# Patient Record
Sex: Female | Born: 1975 | Race: Black or African American | Hispanic: No | Marital: Married | Smoking: Never smoker
Health system: Southern US, Community
[De-identification: ages and names within clinical notes are randomized; demographics above are authoritative.]

## PROBLEM LIST (undated history)

## (undated) DIAGNOSIS — D259 Leiomyoma of uterus, unspecified: Secondary | ICD-10-CM

## (undated) DIAGNOSIS — J45909 Unspecified asthma, uncomplicated: Secondary | ICD-10-CM

## (undated) DIAGNOSIS — D649 Anemia, unspecified: Secondary | ICD-10-CM

## (undated) DIAGNOSIS — E039 Hypothyroidism, unspecified: Secondary | ICD-10-CM

## (undated) HISTORY — DX: Unspecified asthma, uncomplicated: J45.909

## (undated) HISTORY — PX: WISDOM TOOTH EXTRACTION: SHX21

---

## 2007-08-28 ENCOUNTER — Ambulatory Visit: Payer: Self-pay | Admitting: Internal Medicine

## 2008-03-07 ENCOUNTER — Emergency Department: Payer: Self-pay | Admitting: Emergency Medicine

## 2011-08-22 LAB — HM PAP SMEAR: HM Pap smear: NORMAL

## 2012-03-03 DIAGNOSIS — N979 Female infertility, unspecified: Secondary | ICD-10-CM | POA: Insufficient documentation

## 2012-09-01 ENCOUNTER — Ambulatory Visit: Payer: Self-pay | Admitting: Obstetrics and Gynecology

## 2013-06-15 ENCOUNTER — Encounter: Payer: Self-pay | Admitting: Maternal & Fetal Medicine

## 2013-06-16 ENCOUNTER — Ambulatory Visit: Payer: Self-pay | Admitting: Oncology

## 2013-06-24 ENCOUNTER — Emergency Department: Payer: Self-pay | Admitting: Emergency Medicine

## 2013-06-24 LAB — CBC
HCT: 36.2 % (ref 35.0–47.0)
HGB: 11.5 g/dL — ABNORMAL LOW (ref 12.0–16.0)
MCH: 25.7 pg — ABNORMAL LOW (ref 26.0–34.0)
MCHC: 31.7 g/dL — ABNORMAL LOW (ref 32.0–36.0)
MCV: 81 fL (ref 80–100)
Platelet: 282 10*3/uL (ref 150–440)
RBC: 4.47 10*6/uL (ref 3.80–5.20)
RDW: 14.3 % (ref 11.5–14.5)
WBC: 10.2 10*3/uL (ref 3.6–11.0)

## 2013-06-24 LAB — COMPREHENSIVE METABOLIC PANEL
Albumin: 3.5 g/dL (ref 3.4–5.0)
Alkaline Phosphatase: 63 U/L
Anion Gap: 9 (ref 7–16)
BUN: 7 mg/dL (ref 7–18)
Bilirubin,Total: 0.3 mg/dL (ref 0.2–1.0)
Calcium, Total: 9 mg/dL (ref 8.5–10.1)
Chloride: 108 mmol/L — ABNORMAL HIGH (ref 98–107)
Co2: 22 mmol/L (ref 21–32)
Creatinine: 0.88 mg/dL (ref 0.60–1.30)
EGFR (African American): >60
EGFR (Non-African Amer.): >60
Glucose: 104 mg/dL — ABNORMAL HIGH (ref 65–99)
Osmolality: 276 (ref 275–301)
Potassium: 3.4 mmol/L — ABNORMAL LOW (ref 3.5–5.1)
SGOT(AST): 27 U/L (ref 15–37)
SGPT (ALT): 13 U/L (ref 12–78)
Sodium: 139 mmol/L (ref 136–145)
Total Protein: 7.9 g/dL (ref 6.4–8.2)

## 2013-06-24 LAB — HCG, QUANTITATIVE, PREGNANCY: Beta Hcg, Quant.: 3279 m[IU]/mL — ABNORMAL HIGH

## 2013-07-15 ENCOUNTER — Ambulatory Visit: Payer: Self-pay | Admitting: Oncology

## 2013-11-18 DIAGNOSIS — IMO0001 Reserved for inherently not codable concepts without codable children: Secondary | ICD-10-CM | POA: Insufficient documentation

## 2015-01-06 ENCOUNTER — Ambulatory Visit (INDEPENDENT_AMBULATORY_CARE_PROVIDER_SITE_OTHER): Payer: Managed Care, Other (non HMO) | Admitting: Internal Medicine

## 2015-01-06 ENCOUNTER — Encounter: Payer: Self-pay | Admitting: Internal Medicine

## 2015-01-06 VITALS — BP 108/72 | HR 69 | Temp 98.0°F | Ht 63.0 in | Wt 182.0 lb

## 2015-01-06 DIAGNOSIS — Z23 Encounter for immunization: Secondary | ICD-10-CM | POA: Diagnosis not present

## 2015-01-06 DIAGNOSIS — J4520 Mild intermittent asthma, uncomplicated: Secondary | ICD-10-CM | POA: Diagnosis not present

## 2015-01-06 DIAGNOSIS — S161XXD Strain of muscle, fascia and tendon at neck level, subsequent encounter: Secondary | ICD-10-CM

## 2015-01-06 DIAGNOSIS — S161XXA Strain of muscle, fascia and tendon at neck level, initial encounter: Secondary | ICD-10-CM

## 2015-01-06 DIAGNOSIS — J45909 Unspecified asthma, uncomplicated: Secondary | ICD-10-CM | POA: Insufficient documentation

## 2015-01-06 NOTE — Progress Notes (Signed)
Pre visit review using our clinic review tool, if applicable. No additional management support is needed unless otherwise documented below in the visit note. 

## 2015-01-06 NOTE — Patient Instructions (Signed)
Cervical Sprain  A cervical sprain is an injury in the neck in which the strong, fibrous tissues (ligaments) that connect your neck bones stretch or tear. Cervical sprains can range from mild to severe. Severe cervical sprains can cause the neck vertebrae to be unstable. This can lead to damage of the spinal cord and can result in serious nervous system problems. The amount of time it takes for a cervical sprain to get better depends on the cause and extent of the injury. Most cervical sprains heal in 1 to 3 weeks.  CAUSES   Severe cervical sprains may be caused by:    Contact sport injuries (such as from football, rugby, wrestling, hockey, auto racing, gymnastics, diving, martial arts, or boxing).    Motor vehicle collisions.    Whiplash injuries. This is an injury from a sudden forward and backward whipping movement of the head and neck.   Falls.   Mild cervical sprains may be caused by:    Being in an awkward position, such as while cradling a telephone between your ear and shoulder.    Sitting in a chair that does not offer proper support.    Working at a poorly designed computer station.    Looking up or down for long periods of time.   SYMPTOMS    Pain, soreness, stiffness, or a burning sensation in the front, back, or sides of the neck. This discomfort may develop immediately after the injury or slowly, 24 hours or more after the injury.    Pain or tenderness directly in the middle of the back of the neck.    Shoulder or upper back pain.    Limited ability to move the neck.    Headache.    Dizziness.    Weakness, numbness, or tingling in the hands or arms.    Muscle spasms.    Difficulty swallowing or chewing.    Tenderness and swelling of the neck.   DIAGNOSIS   Most of the time your health care provider can diagnose a cervical sprain by taking your history and doing a physical exam. Your health care provider will ask about previous neck injuries and any known neck  problems, such as arthritis in the neck. X-rays may be taken to find out if there are any other problems, such as with the bones of the neck. Other tests, such as a CT scan or MRI, may also be needed.   TREATMENT   Treatment depends on the severity of the cervical sprain. Mild sprains can be treated with rest, keeping the neck in place (immobilization), and pain medicines. Severe cervical sprains are immediately immobilized. Further treatment is done to help with pain, muscle spasms, and other symptoms and may include:   Medicines, such as pain relievers, numbing medicines, or muscle relaxants.    Physical therapy. This may involve stretching exercises, strengthening exercises, and posture training. Exercises and improved posture can help stabilize the neck, strengthen muscles, and help stop symptoms from returning.   HOME CARE INSTRUCTIONS    Put ice on the injured area.     Put ice in a plastic bag.     Place a towel between your skin and the bag.     Leave the ice on for 15-20 minutes, 3-4 times a day.    If your injury was severe, you may have been given a cervical collar to wear. A cervical collar is a two-piece collar designed to keep your neck from moving while it heals.      Do not remove the collar unless instructed by your health care provider.    If you have long hair, keep it outside of the collar.    Ask your health care provider before making any adjustments to your collar. Minor adjustments may be required over time to improve comfort and reduce pressure on your chin or on the back of your head.    Ifyou are allowed to remove the collar for cleaning or bathing, follow your health care provider's instructions on how to do so safely.    Keep your collar clean by wiping it with mild soap and water and drying it completely. If the collar you have been given includes removable pads, remove them every 1-2 days and hand wash them with soap and water. Allow them to air dry. They should be completely  dry before you wear them in the collar.    If you are allowed to remove the collar for cleaning and bathing, wash and dry the skin of your neck. Check your skin for irritation or sores. If you see any, tell your health care provider.    Do not drive while wearing the collar.    Only take over-the-counter or prescription medicines for pain, discomfort, or fever as directed by your health care provider.    Keep all follow-up appointments as directed by your health care provider.    Keep all physical therapy appointments as directed by your health care provider.    Make any needed adjustments to your workstation to promote good posture.    Avoid positions and activities that make your symptoms worse.    Warm up and stretch before being active to help prevent problems.   SEEK MEDICAL CARE IF:    Your pain is not controlled with medicine.    You are unable to decrease your pain medicine over time as planned.    Your activity level is not improving as expected.   SEEK IMMEDIATE MEDICAL CARE IF:    You develop any bleeding.   You develop stomach upset.   You have signs of an allergic reaction to your medicine.    Your symptoms get worse.    You develop new, unexplained symptoms.    You have numbness, tingling, weakness, or paralysis in any part of your body.   MAKE SURE YOU:    Understand these instructions.   Will watch your condition.   Will get help right away if you are not doing well or get worse.     This information is not intended to replace advice given to you by your health care provider. Make sure you discuss any questions you have with your health care provider.     Document Released: 10/29/2006 Document Revised: 01/06/2013 Document Reviewed: 07/09/2012  Elsevier Interactive Patient Education 2016 [employer]

## 2015-01-06 NOTE — Assessment & Plan Note (Signed)
Avoid triggers Continue Albuterol prn
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
## 2015-01-17 ENCOUNTER — Ambulatory Visit (INDEPENDENT_AMBULATORY_CARE_PROVIDER_SITE_OTHER): Payer: Managed Care, Other (non HMO) | Admitting: Urgent Care

## 2015-01-17 VITALS — BP 104/64 | HR 82 | Temp 98.5°F | Resp 16 | Ht 64.0 in | Wt 184.0 lb

## 2015-01-17 DIAGNOSIS — R350 Frequency of micturition: Secondary | ICD-10-CM | POA: Diagnosis not present

## 2015-01-17 DIAGNOSIS — T7840XA Allergy, unspecified, initial encounter: Secondary | ICD-10-CM

## 2015-01-17 LAB — POCT URINALYSIS DIP (MANUAL ENTRY)
Bilirubin, UA: NEGATIVE
Glucose, UA: NEGATIVE
Ketones, POC UA: NEGATIVE
Leukocytes, UA: NEGATIVE
Nitrite, UA: NEGATIVE
Protein Ur, POC: NEGATIVE
Spec Grav, UA: 1.01
Urobilinogen, UA: 0.2
pH, UA: 5

## 2015-01-17 LAB — POC MICROSCOPIC URINALYSIS (UMFC): Mucus: ABSENT

## 2015-01-17 LAB — POCT URINE PREGNANCY: Preg Test, Ur: NEGATIVE

## 2015-01-17 NOTE — Patient Instructions (Signed)
-   Please take Benadryl 25-50mg  every 6 to 8 hours - OR you can take Zyrtec (cetirizine) 10mg  once daily with Zantac (ranitidine) twice daily to avoid sleepiness.   Allergies An allergy is an abnormal reaction to a substance by the body's defense system (immune system). Allergies can develop at any age. WHAT CAUSES ALLERGIES? An allergic reaction happens when the immune system mistakenly reacts to a normally harmless substance, called an allergen, as if it were harmful. The immune system releases antibodies to fight the substance. Antibodies eventually release a chemical called histamine into the bloodstream. The release of histamine is meant to protect the body from infection, but it also causes discomfort. An allergic reaction can be triggered by:  Eating an allergen.  Inhaling an allergen.  Touching an allergen. WHAT TYPES OF ALLERGIES ARE THERE? There are many types of allergies. Common types include:  Seasonal allergies. People with this type of allergy are usually allergic to substances that are only present during certain seasons, such as molds and pollens.  Food allergies.  Drug allergies.  Insect allergies.  Animal dander allergies. WHAT ARE SYMPTOMS OF ALLERGIES? Possible allergy symptoms include:  Swelling of the lips, face, tongue, mouth, or throat.  Sneezing, coughing, or wheezing.  Nasal congestion.  Tingling in the mouth.  Rash.  Itching.  Itchy, red, swollen areas of skin (hives).  Watery eyes.  Vomiting.  Diarrhea.  Dizziness.  Lightheadedness.  Fainting.  Trouble breathing or swallowing.  Chest tightness.  Rapid heartbeat. HOW ARE ALLERGIES DIAGNOSED? Allergies are diagnosed with a medical and family history and one or more of the following:  Skin tests.  Blood tests.  A food diary. A food diary is a record of all the foods and drinks you have in a day and of all the symptoms you experience.  The results of an elimination diet. An  elimination diet involves eliminating foods from your diet and then adding them back in one by one to find out if a certain food causes an allergic reaction. HOW ARE ALLERGIES TREATED? There is no cure for allergies, but allergic reactions can be treated with medicine. Severe reactions usually need to be treated at a hospital. HOW CAN REACTIONS BE PREVENTED? The best way to prevent an allergic reaction is by avoiding the substance you are allergic to. Allergy shots and medicines can also help prevent reactions in some cases. People with severe allergic reactions may be able to prevent a life-threatening reaction called anaphylaxis with a medicine given right after exposure to the allergen.   This information is not intended to replace advice given to you by your health care provider. Make sure you discuss any questions you have with your health care provider.   Document Released: 03/27/2002 Document Revised: 01/22/2014 Document Reviewed: 10/13/2013 Elsevier Interactive Patient Education [employer].

## 2015-01-17 NOTE — Progress Notes (Signed)
MRN: [id number] DOB: 09/02/1975  Subjective:   Michelle Dudley is a 40 y.o. female presenting for chief complaint of Allergic Reaction  Allergic reaction - Reports 1 day history of reaction to permethrin spray. Patient is planning on traveling, sprayed her clothes as instructed on the spray bottle. Patient started feeling itching in her hands and nose after packing the clothes that she used the spray on. Reports that her chest started feeling tight, arms felt heavy. Took a benadryl x1 last night, took it again twice today. Also, used her albuterol inhaler once. She has since washed her clothes multiple times. Denies fever, shob, wheezing, facial swelling, throat closing, rash, vomiting, abdominal pain. Denies smoking cigarettes, has ~1/2 drink of alcohol once a week.  Urinary frequency - Reports 1 day history of urinary frequency. She is trying to get pregnant with her husband and is wondering if this is a reaction to benadryl or pregnancy. She denies polydipsia, dysuria, hematuria, cloudy urine. Admits family history of diabetes in mother, siblings. Also has family history of hyperlipidemia.  Michelle Dudley has a current medication list which includes the following prescription(s): albuterol, amino acids, cholecalciferol, co-enzyme q-10, multivitamin, and cyclobenzaprine. Also has No Known Allergies.  Michelle Dudley  has a past medical history of Allergy-induced asthma. Also  has past surgical history that includes Wisdom tooth extraction.  Objective:   Vitals: BP 104/64 mmHg  Pulse 82  Temp(Src) 98.5 F (36.9 C)  Resp 16  Ht 5\' 4"  (1.626 m)  Wt 184 lb (83.462 kg)  BMI 31.57 kg/m2  SpO2 98%  LMP 12/24/2014  Physical Exam  Constitutional: She is oriented to person, place, and time. She appears well-developed and well-nourished.  HENT:  Mouth/Throat: Oropharynx is clear and moist.  TM's intact bilaterally, no effusions or erythema. Nasal turbinates pink and moist, nasal passages patent. No sinus  tenderness. Oropharynx clear, mucous membranes moist, dentition in good repair.  Eyes: Right eye exhibits no discharge. Left eye exhibits no discharge. No scleral icterus.  Neck: No thyromegaly present.  Cardiovascular: Normal rate, regular rhythm and intact distal pulses.  Exam reveals no gallop and no friction rub.   No murmur heard. Pulmonary/Chest: No respiratory distress. She has no wheezes. She has no rales.  Abdominal: Soft. Bowel sounds are normal. She exhibits no distension and no mass. There is no tenderness.  Neurological: She is alert and oriented to person, place, and time.  Skin: Skin is warm and dry. No rash noted. No erythema. No pallor.   Results for orders placed or performed in visit on 01/17/15 (from the past 24 hour(s))  POCT urinalysis dipstick     Status: Abnormal   Collection Time: 01/17/15  8:11 PM  Result Value Ref Range   Color, UA yellow yellow   Clarity, UA clear clear   Glucose, UA negative negative   Bilirubin, UA negative negative   Ketones, POC UA negative negative   Spec Grav, UA 1.010    Blood, UA trace-lysed (A) negative   pH, UA 5.0    Protein Ur, POC negative negative   Urobilinogen, UA 0.2    Nitrite, UA Negative Negative   Leukocytes, UA Negative Negative  POCT Microscopic Urinalysis ([facility])     Status: Abnormal   Collection Time: 01/17/15  8:11 PM  Result Value Ref Range   WBC,UR,HPF,POC None None WBC/hpf   RBC,UR,HPF,POC None None RBC/hpf   Bacteria None None, Too numerous to count   Mucus Absent Absent   Epithelial Cells, UR Per  Microscopy Few (A) None, Too numerous to count cells/hpf  POCT urine pregnancy     Status: None   Collection Time: 01/17/15  8:11 PM  Result Value Ref Range   Preg Test, Ur Negative Negative   Assessment and Plan :   1. Allergic reaction, initial encounter - Counseled on skin reactions to allergens. Patient's physical exam findings are very reassuring. RTC if worsening symptoms develop.  2. Urinary  frequency - Likely related to her cycle which patient thinks is coming in the next couple of days. Continue to monitor, if symptoms of infection develop   Michelle Eagles, PA-C [facility] [phone] 01/17/2015 7:49 PM

## 2015-01-24 ENCOUNTER — Encounter: Payer: Self-pay | Admitting: Gastroenterology

## 2015-01-28 ENCOUNTER — Encounter: Payer: Self-pay | Admitting: Internal Medicine

## 2015-01-28 ENCOUNTER — Ambulatory Visit (INDEPENDENT_AMBULATORY_CARE_PROVIDER_SITE_OTHER): Payer: Managed Care, Other (non HMO) | Admitting: Internal Medicine

## 2015-01-28 VITALS — BP 106/68 | HR 72 | Temp 98.2°F | Wt 178.0 lb

## 2015-01-28 DIAGNOSIS — R12 Heartburn: Secondary | ICD-10-CM | POA: Diagnosis not present

## 2015-01-28 DIAGNOSIS — R42 Dizziness and giddiness: Secondary | ICD-10-CM | POA: Diagnosis not present

## 2015-01-28 DIAGNOSIS — H6982 Other specified disorders of Eustachian tube, left ear: Secondary | ICD-10-CM | POA: Diagnosis not present

## 2015-01-28 LAB — CBC
HCT: 38.7 % (ref 36.0–46.0)
Hemoglobin: 12 g/dL (ref 12.0–15.0)
MCHC: 31 g/dL (ref 30.0–36.0)
MCV: 80.1 fl (ref 78.0–100.0)
Platelets: 326 10*3/uL (ref 150.0–400.0)
RBC: 4.83 Mil/uL (ref 3.87–5.11)
RDW: 15.5 % (ref 11.5–15.5)
WBC: 5.9 10*3/uL (ref 4.0–10.5)

## 2015-01-28 LAB — COMPREHENSIVE METABOLIC PANEL
ALT: 11 U/L (ref 0–35)
AST: 20 U/L (ref 0–37)
Albumin: 4.1 g/dL (ref 3.5–5.2)
Alkaline Phosphatase: 45 U/L (ref 39–117)
BUN: 8 mg/dL (ref 6–23)
CO2: 26 mEq/L (ref 19–32)
Calcium: 9.6 mg/dL (ref 8.4–10.5)
Chloride: 106 mEq/L (ref 96–112)
Creatinine, Ser: 0.89 mg/dL (ref 0.40–1.20)
GFR: 91.56 mL/min (ref >60.00)
Glucose, Bld: 82 mg/dL (ref 70–99)
Potassium: 4.1 mEq/L (ref 3.5–5.1)
Sodium: 138 mEq/L (ref 135–145)
Total Bilirubin: 0.5 mg/dL (ref 0.2–1.2)
Total Protein: 7.9 g/dL (ref 6.0–8.3)

## 2015-01-28 LAB — TSH: TSH: 3.18 u[IU]/mL (ref 0.35–4.50)

## 2015-01-28 NOTE — Progress Notes (Signed)
Subjective:    Patient ID: Michelle Dudley, female    DOB: [birth year], 40 y.o.   MRN: [id number]  HPI  Pt presents to the clinic today with multiple complaints:  1- Dizziness. This started 2 weeks ago, while she was in [place]. She describes it as a sense of imbalance. It is intermittent. She reports this is associated with nausea, and blurred vision.  She does report her limbs feel "heavy" when she is experiencing the dizziness. She denies near syncope or headache. She denies dizziness with head movements or changes in positions. She has not tried anything OTC.  2- She also reports a burning sensation in her chest. This started 3 weeks ago. She has noticed some nausea, gas and bloating but denies belching vomiting, diarrhea, constipation or blood in her stool. She thinks it may be reflux. The pain is worse after eating or when laying down. She is having a BM every other day. Her stool ranges from normal to a little loose. This morning her stool was normal in color, but she has noticed that it has contained mucous at times. She has taken Pepto Bismol with some relief.  3- She also c/o left ear fullness. This started 3 weeks ago. She denies runny nose, nasal congestion or sore throat. She denies fever, chills or body aches. She has not tried anything OTC. She has a history of seasonal allergies but does not take anything for them on a consistent basis.  Review of Systems  Past Medical History  Diagnosis Date  . Allergy-induced asthma     Current Outpatient Prescriptions  Medication Sig Dispense Refill  . albuterol (PROVENTIL HFA;VENTOLIN HFA) 108 (90 BASE) MCG/ACT inhaler Inhale 1-2 puffs into the lungs every 6 (six) hours as needed for wheezing or shortness of breath.    . Amino Acids (L-CARNITINE PO) Take 1 capsule by mouth daily.    . cholecalciferol (VITAMIN D) 1000 UNITS tablet Take 1,000 Units by mouth every other day.    Marland Kitchen co-enzyme Q-10 50 MG capsule Take 50 mg by mouth daily.      . cyclobenzaprine (FLEXERIL) 10 MG tablet Reported on 01/17/2015    . Multiple Vitamin (MULTIVITAMIN) tablet Take 1 tablet by mouth daily.     No current facility-administered medications for this visit.    No Known Allergies  Family History  Problem Relation Age of Onset  . Hypertension Mother   . Hypertension Father   . Kidney disease Father   . Diabetes Father   . Uterine cancer Maternal Aunt   . Cancer Maternal Aunt     uterine  . Diabetes Maternal Grandmother     Social History   Social History  . Marital Status: Married    Spouse Name: N/A  . Number of Children: N/A  . Years of Education: N/A   Occupational History  . Not on file.   Social History Main Topics  . Smoking status: Never Smoker   . Smokeless tobacco: Never Used  . Alcohol Use: 0.0 oz/week    0 Standard drinks or equivalent per week     Comment: occasiona;  Marland Kitchen Drug Use: No  . Sexual Activity: Yes   Other Topics Concern  . Not on file   Social History Narrative     Constitutional: Denies fever, malaise, fatigue, headache or abrupt weight changes.  HEENT: Pt reports ear fullness. Denies eye pain, eye redness, ear pain, ringing in the ears, wax buildup, runny nose, nasal congestion, bloody nose, or  sore throat. Respiratory: Denies difficulty breathing, shortness of breath, cough or sputum production.   Cardiovascular: Denies chest pain, chest tightness, palpitations or swelling in the hands or feet.  Gastrointestinal: Pt reports burning sensation in stomach. Denies constipation, diarrhea or blood in the stool.   Neurological: Pt reports dizziness. Denies difficulty with memory, difficulty with speech or problems with balance and coordination.    No other specific complaints in a complete review of systems (except as listed in HPI above).     Objective:   Physical Exam   BP 106/68 mmHg  Pulse 72  Temp(Src) 98.2 F (36.8 C) (Oral)  Wt 178 lb (80.74 kg)  SpO2 98%  LMP 01/20/2015 Wt  Readings from Last 3 Encounters:  01/28/15 178 lb (80.74 kg)  01/17/15 184 lb (83.462 kg)  01/06/15 182 lb (82.555 kg)    General: Appears her stated age, well developed, well nourished in NAD. HEENT: Head: normal shape and size, no sinus tenderness; Ears: Tm's gray and intact, normal light reflex, + effusion on the left; Nose: mucosa pink and moist, septum midline; Throat/Mouth: Teeth present, mucosa pink and moist, no exudate, lesions or ulcerations noted.  Neck:  No adenopathy noted.  Cardiovascular: Normal rate and rhythm. S1,S2 noted.  No murmur, rubs or gallops noted.  Pulmonary/Chest: Normal effort and positive vesicular breath sounds. No respiratory distress. No wheezes, rales or ronchi noted.  Abdomen: Soft and nontender. Normal bowel sounds. Neurological: Alert and oriented. No nystagmus. Coordination normal.    BMET    Component Value Date/Time   NA 139 06/24/2013 0307   K 3.4* 06/24/2013 0307   CL 108* 06/24/2013 0307   CO2 22 06/24/2013 0307   GLUCOSE 104* 06/24/2013 0307   BUN 7 06/24/2013 0307   CREATININE 0.88 06/24/2013 0307   CALCIUM 9.0 06/24/2013 0307   GFRNONAA >60 06/24/2013 0307   GFRAA >60 06/24/2013 0307    Lipid Panel  No results found for: CHOL, TRIG, HDL, CHOLHDL, VLDL, LDLCALC  CBC    Component Value Date/Time   WBC 10.2 06/24/2013 0307   RBC 4.47 06/24/2013 0307   HGB 11.5* 06/24/2013 0307   HCT 36.2 06/24/2013 0307   PLT 282 06/24/2013 0307   MCV 81 06/24/2013 0307   MCH 25.7* 06/24/2013 0307   MCHC 31.7* 06/24/2013 0307   RDW 14.3 06/24/2013 0307    Hgb A1C No results found for: HGBA1C      Assessment & Plan:   Heartburn:  Avoid NSAIDs or any foods that may cause this burning sensation in your chest Try Prilosec 20 mg daily for 14 days Avoid eating too much or laying down right affter you eat.  ETD, left and dizziness:  Start taking Flonase BID x 1 week If persists, consider Prednisone Will check TSH, CBC and CMET  today  Will reevaluated in 2 weeks, sooner if worse

## 2015-01-28 NOTE — Patient Instructions (Signed)

## 2015-01-28 NOTE — Progress Notes (Signed)
Pre visit review using our clinic review tool, if applicable. No additional management support is needed unless otherwise documented below in the visit note. 

## 2015-02-02 ENCOUNTER — Encounter: Payer: Self-pay | Admitting: Internal Medicine

## 2015-03-15 ENCOUNTER — Ambulatory Visit (INDEPENDENT_AMBULATORY_CARE_PROVIDER_SITE_OTHER): Payer: Managed Care, Other (non HMO) | Admitting: Gastroenterology

## 2015-03-15 ENCOUNTER — Encounter: Payer: Self-pay | Admitting: Gastroenterology

## 2015-03-15 VITALS — BP 118/74 | HR 84 | Ht 63.0 in | Wt 178.4 lb

## 2015-03-15 DIAGNOSIS — K219 Gastro-esophageal reflux disease without esophagitis: Secondary | ICD-10-CM | POA: Diagnosis not present

## 2015-03-15 NOTE — Progress Notes (Signed)
Gastroenterology and Hepatology Consult Note:  History: Michelle Dudley 03/15/2015  Referring physician: Webb Silversmith, NP  Reason for consult/chief complaint: Gas; Heartburn; and Gastroesophageal Reflux   Subjective HPI:  This is a delightful 40 year old woman self-referred for some upper GI symptoms. During a recent trip to [place] she had several episodes of pyrosis while laying down, associated with some feelings of numbness in the left arm and dizziness. She is not recall having them prior to that. Since then she's had some episodes where he feels that there is gas trapped in the chest or relieved with belching. She tried a PPI with unclear improvement. Sometimes she may have an episode of feeling dizzy or focal numbness in the arm without the feeling in the chest. She might get somewhat better with Pepto-Bismol or by belching. Altogether these symptoms are slowly becoming less frequent and severe. She was just concerned there may be some serious underlying condition, especially since she is on Clomid trying to get pregnant. He denies anorexia weight loss dysphagia odynophagia altered bowel habits or rectal bleeding.  ROS:  Review of Systems she denies dyspnea or cough fever rash or arthralgias Past Medical History: Past Medical History  Diagnosis Date  . Allergy-induced asthma      Past Surgical History: Past Surgical History  Procedure Laterality Date  . Wisdom tooth extraction       Family History: Family History  Problem Relation Age of Onset  . Hypertension Mother   . Hypertension Father   . Kidney disease Father   . Diabetes Father   . Uterine cancer Maternal Aunt   . Cancer Maternal Aunt     uterine  . Diabetes Maternal Grandmother     Social History: Social History   Social History  . Marital Status: Married    Spouse Name: N/A  . Number of Children: N/A  . Years of Education: N/A   Social History Main Topics  . Smoking status: Never Smoker   .  Smokeless tobacco: Never Used  . Alcohol Use: 0.0 oz/week    0 Standard drinks or equivalent per week     Comment: occasiona;  Marland Kitchen Drug Use: No  . Sexual Activity: Yes   Other Topics Concern  . None   Social History Narrative   High school Merchant navy officer at [employer] Allergies: No Known Allergies  Outpatient Meds: Current Outpatient Prescriptions  Medication Sig Dispense Refill  . albuterol (PROVENTIL HFA;VENTOLIN HFA) 108 (90 BASE) MCG/ACT inhaler Inhale 1-2 puffs into the lungs every 6 (six) hours as needed for wheezing or shortness of breath.    . Amino Acids (L-CARNITINE PO) Take 1 capsule by mouth daily.    . cholecalciferol (VITAMIN D) 1000 UNITS tablet Take 1,000 Units by mouth every other day.    . clomiPHENE (CLOMID) 50 MG tablet Take 50 mg by mouth daily. Once a month for 5 days    . co-enzyme Q-10 50 MG capsule Take 50 mg by mouth daily.    . Multiple Vitamin (MULTIVITAMIN) tablet Take 1 tablet by mouth daily.     No current facility-administered medications for this visit.      ___________________________________________________________________ Objective  Exam:  BP 118/74 mmHg  Pulse 84  Ht 5\' 3"  (1.6 m)  Wt 178 lb 6.4 oz (80.922 kg)  BMI 31.61 kg/m2  General: this is a well-appearing young African-American woman   Eyes: sclera anicteric, no redness  ENT: oral mucosa moist without lesions, no cervical or supraclavicular lymphadenopathy, good  dentition  CV: RRR without murmur, S1/S2, no JVD, no peripheral edema  Resp: clear to auscultation bilaterally, normal RR and effort noted  GI: soft, no tenderness, with active bowel sounds. No guarding or palpable organomegaly noted  Skin; warm and dry, no rash or jaundice noted  Neuro: awake, alert and oriented x 3. Normal gross motor function and fluent speech   Assessment: Encounter Diagnosis  Name Primary?  . Gastroesophageal reflux disease, esophagitis presence not specified Yes    Mild  intermittent GERD symptoms, though it is not clear if this feeling of trapped gas in the chest is related to the arm and dizziness symptoms. Overall, the does not seem to be a particular chronic digestive condition or any red flag symptoms.  Plan:  I've no further testing planned, and I have reassured her. She knows to call us if symptoms worsen or change.  Thank you for the courtesy of this consult.  Please call me with any questions or concerns.  Nelida Meuse III

## 2015-03-15 NOTE — Patient Instructions (Signed)
Follow up as needed.  Thank you for Choosing [facility]  Dr Wilfrid Lund III

## 2015-05-03 ENCOUNTER — Encounter: Payer: Self-pay | Admitting: Family Medicine

## 2015-05-03 ENCOUNTER — Ambulatory Visit (INDEPENDENT_AMBULATORY_CARE_PROVIDER_SITE_OTHER): Payer: Managed Care, Other (non HMO) | Admitting: Family Medicine

## 2015-05-03 VITALS — BP 100/60 | HR 86 | Temp 98.9°F | Resp 12 | Wt 174.0 lb

## 2015-05-03 DIAGNOSIS — J309 Allergic rhinitis, unspecified: Secondary | ICD-10-CM

## 2015-05-03 DIAGNOSIS — J069 Acute upper respiratory infection, unspecified: Secondary | ICD-10-CM | POA: Diagnosis not present

## 2015-05-03 MED ORDER — BENZONATATE 100 MG PO CAPS: 100.0000 mg | ORAL_CAPSULE | Freq: Two times a day (BID) | ORAL | Status: DC | PRN

## 2015-05-03 NOTE — Progress Notes (Signed)
Subjective:    Patient ID: Michelle Dudley, female    DOB: 03/20/1975, 40 y.o.   MRN: [id number]  HPI  Ms.  Michelle Dudley is a 40 y.o.female here today complaining of 4-5 days of respiratory symptoms. Initially she had sore throat but this has resolved , she has not noted fever but she has had chills a couple times. Hx of asthma, she has not noted wheezing or dyspnea, last time she used her Albuterol inh was a month ago.  No Hx of recent travel overseas, [place] first [place] of this [place].  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 No known insect bite.  + Hx of allergies,  allergic rhinitis, she has not used her Flonase nasal spray lately. Se is not taking an over-the-counter antihistaminic. She has had some epiphora and eye pruritus. + Frontal, mild pressure headache with no associated nausea, vomiting, or visual changes.  She has tried OTC Mucines, Dyquil, and Nyquil.  Symptoms otherwise stable.    Review of Systems  Constitutional: Positive for chills and fatigue. Negative for fever and appetite change.  HENT: Positive for congestion, ear pain (left, "little"), postnasal drip, rhinorrhea and sneezing. Negative for facial swelling, hearing loss, mouth sores, sinus pressure, sore throat, trouble swallowing and voice change.   Eyes: Positive for discharge and itching. Negative for redness and visual disturbance.  Respiratory: Positive for cough (non productive). Negative for shortness of breath and wheezing.   Cardiovascular: Negative.   Gastrointestinal: Negative for nausea, vomiting, abdominal pain and diarrhea.  Musculoskeletal: Negative for myalgias, arthralgias and neck pain.  Skin: Negative for rash.  Allergic/Immunologic: Positive for environmental allergies.  Neurological: Positive for headaches (frontal). Negative for facial asymmetry, weakness and numbness.  Hematological: Negative for adenopathy. Does not  bruise/bleed easily.      Current Outpatient Prescriptions on File Prior to Visit  Medication Sig Dispense Refill  . albuterol (PROVENTIL HFA;VENTOLIN HFA) 108 (90 BASE) MCG/ACT inhaler Inhale 1-2 puffs into the lungs every 6 (six) hours as needed for wheezing or shortness of breath.    . cholecalciferol (VITAMIN D) 1000 UNITS tablet Take 1,000 Units by mouth every other day.    . Multiple Vitamin (MULTIVITAMIN) tablet Take 1 tablet by mouth daily.     No current facility-administered medications on file prior to visit.     Past Medical History  Diagnosis Date  . Allergy-induced asthma     Social History   Social History  . Marital Status: Married    Spouse Name: N/A  . Number of Children: N/A  . Years of Education: N/A   Social History Main Topics  . Smoking status: Never Smoker   . Smokeless tobacco: Never Used  . Alcohol Use: 0.0 oz/week    0 Standard drinks or equivalent per week     Comment: occasiona;  Marland Kitchen Drug Use: No  . Sexual Activity: Yes   Other Topics Concern  . None   Social History Narrative    Filed Vitals:   05/03/15 1124  BP: 100/60  Pulse: 86  Temp: 98.9 F (37.2 C)  Resp: 12   Body mass index is 30.83 kg/(m^2).  SpO2 Readings from Last 3 Encounters:  05/03/15 97%  01/28/15 98%  01/17/15 98%       Objective:   Physical Exam  Constitutional: She is oriented to person, place, and time. She appears well-developed. She does not appear ill. No  distress.  HENT:  Head: Atraumatic.  Right Ear: External ear and ear canal normal. Tympanic membrane is not erythematous.  Left Ear: External ear and ear canal normal. Tympanic membrane is not erythematous.  Nose: Rhinorrhea present. No sinus tenderness. Right sinus exhibits no maxillary sinus tenderness and no frontal sinus tenderness. Left sinus exhibits no maxillary sinus tenderness and no frontal sinus tenderness.  Mouth/Throat: Uvula is midline and mucous membranes are normal. No oropharyngeal  exudate, posterior oropharyngeal edema or posterior oropharyngeal erythema.  Mild middle ear effusion bilateral. Hypertrophic turbinates.  Eyes: Conjunctivae are normal.  Cardiovascular: Normal rate and regular rhythm.   Pulmonary/Chest: Effort normal and breath sounds normal. No respiratory distress. She has no wheezes. She has no rales.  Lymphadenopathy:       Head (right side): No submandibular adenopathy present.       Head (left side): No submandibular adenopathy present.    She has cervical adenopathy.  Small posterior cervical lymphadenopathy,< 1 cm,  bilateral, no tender  Neurological: She is alert and oriented to person, place, and time.  Skin: Skin is warm. No rash noted.  Psychiatric: She has a normal mood and affect.  Well groomed and good eye contact.  Nursing note and vitals reviewed.      Assessment & Plan:    Svea was seen today for uri.  Diagnoses and all orders for this visit:  Allergic rhinitis, unspecified allergic rhinitis type Some of her symptoms suggest allergic etiology, she still has Flonase nasal spray, recommended to use 1 spray in each nostril twice per day. She can also try steam inhalations. Over-the-counter Aleve or similar take was recommended. Follow-up as needed.   URI, acute Most likely vital, symptomatic treatment is recommended. Even though she is not noticing wheezing, given her history of asthma and nonproductive cough I recommended starting albuterol inhaler 2 puffs every 6 hours for a week. Monitor for fever. Decongestants and autoinflation maneuvers can help with eustachian tube dysfunction, some side effects of decongestants discussed. She doesn't feel she needs a note for work, he was offered. She voices understanding of plan.   -     benzonatate (TESSALON) 100 MG capsule; Take 1 capsule (100 mg total) by mouth 2 (two) times daily as needed for cough.   -Patient advised to return or notify a doctor immediately if symptoms worsen  or persist or new concerns arise.  Betty G. Martinique, MD  [facility]. [place] office.

## 2015-05-03 NOTE — Progress Notes (Signed)
Pre visit review using our clinic review tool, if applicable. No additional management support is needed unless otherwise documented below in the visit note. 

## 2015-05-03 NOTE — Patient Instructions (Signed)
A few things to remember from today's visit:   viral infections are self-limited and treatment is symptomatic.  Plenty of fluids. Honey may help with cough. Cough and nasal congestion could last a few days and sometimes weeks. Please follow in not any better in 1-2 weeks or if worsening symptoms.   Allergies can aggravate problem, start Allegra 180 mg daily OR Zyrtec 10 mg daily. Flonase 1 spray in each nostril 2 times daily. Steam inhalations and nasal saline to rinse nose at night before Flonase.   If you sign-up for My chart, you can communicate easier with [place] in case you have any question or concern.

## 2015-05-04 ENCOUNTER — Telehealth: Payer: Self-pay | Admitting: Internal Medicine

## 2015-05-04 ENCOUNTER — Emergency Department
Admission: EM | Admit: 2015-05-04 | Discharge: 2015-05-04 | Disposition: A | Discharge status: Home / Self Care | Payer: Managed Care, Other (non HMO) | Attending: Emergency Medicine | Admitting: Emergency Medicine

## 2015-05-04 ENCOUNTER — Encounter: Payer: Self-pay | Admitting: Emergency Medicine

## 2015-05-04 ENCOUNTER — Emergency Department: Payer: Managed Care, Other (non HMO)

## 2015-05-04 ENCOUNTER — Telehealth: Payer: Self-pay

## 2015-05-04 ENCOUNTER — Other Ambulatory Visit: Payer: Self-pay | Admitting: Family Medicine

## 2015-05-04 DIAGNOSIS — J069 Acute upper respiratory infection, unspecified: Secondary | ICD-10-CM | POA: Diagnosis not present

## 2015-05-04 DIAGNOSIS — J4520 Mild intermittent asthma, uncomplicated: Secondary | ICD-10-CM

## 2015-05-04 DIAGNOSIS — R0981 Nasal congestion: Secondary | ICD-10-CM | POA: Diagnosis present

## 2015-05-04 DIAGNOSIS — Z79899 Other long term (current) drug therapy: Secondary | ICD-10-CM | POA: Diagnosis not present

## 2015-05-04 DIAGNOSIS — J45909 Unspecified asthma, uncomplicated: Secondary | ICD-10-CM | POA: Insufficient documentation

## 2015-05-04 MED ORDER — ALBUTEROL SULFATE HFA 108 (90 BASE) MCG/ACT IN AERS: 1.0000 | INHALATION_SPRAY | Freq: Four times a day (QID) | RESPIRATORY_TRACT | Status: AC | PRN

## 2015-05-04 MED ORDER — HYDROCOD POLST-CPM POLST ER 10-8 MG/5ML PO SUER
5.0000 mL | Freq: Once | ORAL | Status: AC
  Administered 2015-05-04: 5 mL via ORAL
  Filled 2015-05-04: qty 5

## 2015-05-04 MED ORDER — HYDROCOD POLST-CPM POLST ER 10-8 MG/5ML PO SUER: 5.0000 mL | Freq: Two times a day (BID) | ORAL | Status: DC

## 2015-05-04 MED ORDER — FEXOFENADINE-PSEUDOEPHED ER 60-120 MG PO TB12: 1.0000 | ORAL_TABLET | Freq: Two times a day (BID) | ORAL | Status: DC

## 2015-05-04 NOTE — Discharge Instructions (Signed)

## 2015-05-04 NOTE — Telephone Encounter (Signed)
Dr Martinique left for the day. Pt will have to wait until tomorrow

## 2015-05-04 NOTE — ED Provider Notes (Signed)
[facility] Provider Note  ____________________________________________  Time seen: Approximately 11:11 PM  I have reviewed the triage vital signs and the nursing notes.   HISTORY  Chief Complaint Cough and Nasal Congestion    HPI Michelle Dudley is a 40 y.o. female patient complaining of 1 week of productive cough which is yellow in consistency with sinus congestion and frontal headache. Patient was seen by PCP yesterday and diagnosed with viral illness was given Flonase advises take Tussin. Patient states no relief with these medications. Patient also state has frontal headache and mild vertigo. Patient denies any nausea vomiting diarrhea. Patient rates the pain discomfort as a 4/10.  Past Medical History  Diagnosis Date  . Allergy-induced asthma     Patient Active Problem List   Diagnosis Date Noted  . Allergy-induced asthma 01/06/2015    Past Surgical History  Procedure Laterality Date  . Wisdom tooth extraction      Current Outpatient Rx  Name  Route  Sig  Dispense  Refill  . albuterol (PROVENTIL HFA;VENTOLIN HFA) 108 (90 Base) MCG/ACT inhaler   Inhalation   Inhale 1-2 puffs into the lungs every 6 (six) hours as needed for wheezing or shortness of breath.   1 Inhaler   1   . benzonatate (TESSALON) 100 MG capsule   Oral   Take 1 capsule (100 mg total) by mouth 2 (two) times daily as needed for cough.   20 capsule   0   . chlorpheniramine-HYDROcodone (TUSSIONEX PENNKINETIC ER) 10-8 MG/5ML SUER   Oral   Take 5 mLs by mouth 2 (two) times daily.   115 mL   0   . cholecalciferol (VITAMIN D) 1000 UNITS tablet   Oral   Take 1,000 Units by mouth every other day.         Marland Kitchen DM-Doxylamine-Acetaminophen (NYQUIL COLD & FLU PO)   Oral   Take by mouth.         . fexofenadine-pseudoephedrine (ALLEGRA-D) 60-120 MG 12 hr tablet   Oral   Take 1 tablet by mouth 2 (two) times daily.   20 tablet   0   . Multiple Vitamin  (MULTIVITAMIN) tablet   Oral   Take 1 tablet by mouth daily.           Allergies Review of patient's allergies indicates no known allergies.  Family History  Problem Relation Age of Onset  . Hypertension Mother   . Hypertension Father   . Kidney disease Father   . Diabetes Father   . Uterine cancer Maternal Aunt   . Cancer Maternal Aunt     uterine  . Diabetes Maternal Grandmother     Social History Social History  Substance Use Topics  . Smoking status: Never Smoker   . Smokeless tobacco: Never Used  . Alcohol Use: 0.0 oz/week    0 Standard drinks or equivalent per week     Comment: occasiona;    Review of Systems Constitutional: No fever/chills Eyes: No visual changes. ENT: No sore throat. Sinus congestion Cardiovascular: Denies chest pain. Respiratory: Denies shortness of breath.Productive cough.  Gastrointestinal: No abdominal pain.  No nausea, no vomiting.  No diarrhea.  No constipation. Genitourinary: Negative for dysuria. Musculoskeletal: Negative for back pain. Skin: Negative for rash. Neurological: Positive for frontal headache but denies, focal weakness or numbness.    ____________________________________________   PHYSICAL EXAM:  VITAL SIGNS: ED Triage Vitals  Enc Vitals Group     BP 05/04/15 2221 109/45  mmHg     Pulse Rate 05/04/15 2221 83     Resp --      Temp 05/04/15 2221 98.2 F (36.8 C)     Temp Source 05/04/15 2221 Oral     SpO2 05/04/15 2221 100 %     Weight 05/04/15 2221 173 lb (78.472 kg)     Height 05/04/15 2221 5\' 4"  (1.626 m)     Head Cir --      Peak Flow --      Pain Score 05/04/15 2228 4     Pain Loc --      Pain Edu? --      Excl. in [place]? --     Constitutional: Alert and oriented. Well appearing and in no acute distress. Eyes: Conjunctivae are normal. PERRL. EOMI. Head: Atraumatic. Nose: Bilateral edematous nasal turbinates with clear rhinorrhea. Mouth/Throat: Mucous membranes are moist.  Oropharynx  non-erythematous. Postnasal drainage Neck: No stridor.  No cervical spine tenderness to palpation. Hematological/Lymphatic/Immunilogical: No cervical lymphadenopathy. Cardiovascular: Normal rate, regular rhythm. Grossly normal heart sounds.  Good peripheral circulation. Respiratory: Normal respiratory effort.  No retractions. Lungs CTAB. Nonproductive cough Gastrointestinal: Soft and nontender. No distention. No abdominal bruits. No CVA tenderness. Musculoskeletal: No lower extremity tenderness nor edema.  No joint effusions. Neurologic:  Normal speech and language. No gross focal neurologic deficits are appreciated. No gait instability. Skin:  Skin is warm, dry and intact. No rash noted. Psychiatric: Mood and affect are normal. Speech and behavior are normal.  ____________________________________________   LABS (all labs ordered are listed, but only abnormal results are displayed)  Labs Reviewed - No data to display ____________________________________________  EKG   ____________________________________________  RADIOLOGY  Acute findings on chest x-ray ____________________________________________   PROCEDURES  Procedure(s) performed: None  Critical Care performed: No  ____________________________________________   INITIAL IMPRESSION / ASSESSMENT AND PLAN / ED COURSE  Pertinent labs & imaging results that were available during my care of the patient were reviewed by me and considered in my medical decision making (see chart for details).  Upper respiratory infection. Discussed x-ray results with patient. Advised patient to continue taking Flonase. Patient given a prescription for Allegra-D and Tussionex. Patient advised follow-up family doctor condition persists. ____________________________________________   FINAL CLINICAL IMPRESSION(S) / ED DIAGNOSES  Final diagnoses:  URI (upper respiratory infection)      Sable Feil, PA-C 05/04/15 2339

## 2015-05-04 NOTE — Telephone Encounter (Signed)
Pt was seen yesterday and would like abx call into [facility].. Pt also would like diflucan call into pharm also. Pt states she normally gets yeast infection when on  Abx. Ms Garnette Gunner is not in office this week

## 2015-05-04 NOTE — ED Notes (Signed)
Patient ambulatory to triage with steady gait, without difficulty or distress noted; pt reports prod cough yellow sputum since Sunday with sinus congestion and HA, chills; seen by PCP yesterday and dx with viral illness; taking tussin, flonase, inhaler without relief

## 2015-05-04 NOTE — Telephone Encounter (Signed)
Pt calling; pt seen by Dr Martinique on 05/03/15 and PCP is out of office this week; pt complaining today with prod cough with yellow phlegm, pressure feeling. I transferred call to [place] at [place].

## 2015-05-05 NOTE — Telephone Encounter (Signed)
Please advise 

## 2015-06-03 ENCOUNTER — Encounter: Payer: Self-pay | Admitting: Internal Medicine

## 2015-06-03 ENCOUNTER — Ambulatory Visit (INDEPENDENT_AMBULATORY_CARE_PROVIDER_SITE_OTHER): Payer: Managed Care, Other (non HMO) | Admitting: Internal Medicine

## 2015-06-03 VITALS — BP 108/72 | HR 87 | Temp 98.3°F | Wt 169.0 lb

## 2015-06-03 DIAGNOSIS — L853 Xerosis cutis: Secondary | ICD-10-CM

## 2015-06-03 DIAGNOSIS — R202 Paresthesia of skin: Secondary | ICD-10-CM

## 2015-06-03 DIAGNOSIS — R0789 Other chest pain: Secondary | ICD-10-CM | POA: Diagnosis not present

## 2015-06-03 NOTE — Progress Notes (Signed)
Subjective:    Patient ID: Michelle Dudley, female    DOB: May 06, 1975, 40 y.o.   MRN: [id number]  HPI  Pt presents to the clinic today with c/o bruising and dry skin on her lower legs. She has noticed this intermittently over the last few years. The skin is dry and scaly She was told Tuesday that her progesterone was low and not sure if this is contributing. She is not on any anticoagulant or ASA. She had her liver checked 1/23017 and it was normal. She denies any changes in soaps, lotions or detergents. She has not tried anything OTC for this.  She also wants to discuss this ongoing numbness in her left arm and left leg. This has been going on for months. It is associated with discomfort in her chest and intermittent dizziness. It seems to occur more often when eating. She denies any pain in he neck, back or loss of bowel or bladder. She is not sure what could be causing this. She did note some improvement with Prilosec OTC but stopped taking it. She did see a GI doctor but he told her it was not a GI issue.  Review of Systems      Past Medical History  Diagnosis Date  . Allergy-induced asthma     Current Outpatient Prescriptions  Medication Sig Dispense Refill  . albuterol (PROVENTIL HFA;VENTOLIN HFA) 108 (90 Base) MCG/ACT inhaler Inhale 1-2 puffs into the lungs every 6 (six) hours as needed for wheezing or shortness of breath. 1 Inhaler 1  . chlorpheniramine-HYDROcodone (TUSSIONEX PENNKINETIC ER) 10-8 MG/5ML SUER Take 5 mLs by mouth 2 (two) times daily. 115 mL 0  . cholecalciferol (VITAMIN D) 1000 UNITS tablet Take 1,000 Units by mouth every other day.    Marland Kitchen DM-Doxylamine-Acetaminophen (NYQUIL COLD & FLU PO) Take by mouth.    . fexofenadine-pseudoephedrine (ALLEGRA-D) 60-120 MG 12 hr tablet Take 1 tablet by mouth 2 (two) times daily. 20 tablet 0  . Multiple Vitamin (MULTIVITAMIN) tablet Take 1 tablet by mouth daily.     No current facility-administered medications for this visit.     No Known Allergies  Family History  Problem Relation Age of Onset  . Hypertension Mother   . Hypertension Father   . Kidney disease Father   . Diabetes Father   . Uterine cancer Maternal Aunt   . Cancer Maternal Aunt     uterine  . Diabetes Maternal Grandmother     Social History   Social History  . Marital Status: Married    Spouse Name: N/A  . Number of Children: N/A  . Years of Education: N/A   Occupational History  . Not on file.   Social History Main Topics  . Smoking status: Never Smoker   . Smokeless tobacco: Never Used  . Alcohol Use: 0.0 oz/week    0 Standard drinks or equivalent per week     Comment: occasiona;  Marland Kitchen Drug Use: No  . Sexual Activity: Yes   Other Topics Concern  . Not on file   Social History Narrative     Constitutional: Denies fever, malaise, fatigue, headache or abrupt weight changes.  HEENT: Denies eye pain, eye redness, ear pain, ringing in the ears, wax buildup, runny nose, nasal congestion, bloody nose, or sore throat. Respiratory: Denies difficulty breathing, shortness of breath, cough or sputum production.   Cardiovascular: Pt reports chest tightness. Denies chest pain, chest palpitations or swelling in the hands or feet.  Gastrointestinal: Pt reports decreased  appetite. Denies abdominal pain, bloating, constipation, diarrhea or blood in the stool.  GU: Denies urgency, frequency, pain with urination, burning sensation, blood in urine, odor or discharge. Musculoskeletal: Denies decrease in range of motion, difficulty with gait, muscle pain or joint pain and swelling.  Skin: Pt reports bruising on her leg. Denies redness, rashes, lesions or ulcercations.  Neurological: Pt reports dizziness and numbness. Denies difficulty with memory, difficulty with speech or problems with balance and coordination.  Psych: Pt reports stress. Denies anxiety, depression, SI/HI.  No other specific complaints in a complete review of systems (except as  listed in HPI above).  Objective:   Physical Exam   BP 108/72 mmHg  Pulse 87  Temp(Src) 98.3 F (36.8 C) (Oral)  Wt 169 lb (76.658 kg)  SpO2 99%  LMP 06/02/2015 Wt Readings from Last 3 Encounters:  06/03/15 169 lb (76.658 kg)  05/04/15 173 lb (78.472 kg)  05/03/15 174 lb (78.926 kg)    General: Appears her stated age, well developed, well nourished in NAD. Skin: Warm, dry and intact. Dry skin noted on BLE. Dark, scattered lesions noted on BLE, no bruising noted. HEENT: Head: normal shape and size; Eyes: sclera white, no icterus, conjunctiva pink, PERRLA and EOMs intact, no nystagmus noted;  Neck:  Neck supple, trachea midline. No masses, lumps or thyromegaly present.  Cardiovascular: Normal rate and rhythm. S1,S2 noted.  No murmur, rubs or gallops noted. Radial pulses and pedal pulses 2+ bilaterally. Pulmonary/Chest: Normal effort and positive vesicular breath sounds. No respiratory distress. No wheezes, rales or ronchi noted.  Neurological: Alert and oriented. Sensation intact to BUE/BLE. Psychiatric: She is mildly anxious appearing today.  BMET    Component Value Date/Time   NA 138 01/28/2015 1417   NA 139 06/24/2013 0307   K 4.1 01/28/2015 1417   K 3.4* 06/24/2013 0307   CL 106 01/28/2015 1417   CL 108* 06/24/2013 0307   CO2 26 01/28/2015 1417   CO2 22 06/24/2013 0307   GLUCOSE 82 01/28/2015 1417   GLUCOSE 104* 06/24/2013 0307   BUN 8 01/28/2015 1417   BUN 7 06/24/2013 0307   CREATININE 0.89 01/28/2015 1417   CREATININE 0.88 06/24/2013 0307   CALCIUM 9.6 01/28/2015 1417   CALCIUM 9.0 06/24/2013 0307   GFRNONAA >60 06/24/2013 0307   GFRAA >60 06/24/2013 0307    Lipid Panel  No results found for: CHOL, TRIG, HDL, CHOLHDL, VLDL, LDLCALC  CBC    Component Value Date/Time   WBC 5.9 01/28/2015 1417   WBC 10.2 06/24/2013 0307   RBC 4.83 01/28/2015 1417   RBC 4.47 06/24/2013 0307   HGB 12.0 01/28/2015 1417   HGB 11.5* 06/24/2013 0307   HCT 38.7 01/28/2015  1417   HCT 36.2 06/24/2013 0307   PLT 326.0 01/28/2015 1417   PLT 282 06/24/2013 0307   MCV 80.1 01/28/2015 1417   MCV 81 06/24/2013 0307   MCH 25.7* 06/24/2013 0307   MCHC 31.0 01/28/2015 1417   MCHC 31.7* 06/24/2013 0307   RDW 15.5 01/28/2015 1417   RDW 14.3 06/24/2013 0307    Hgb A1C No results found for: HGBA1C      Assessment & Plan:   Paresthesia, chest discomfort, dry skin, rash of lower extremity:  Exam benign Will check B12, TSH, A1C, folate and H Pylori If labs normal, consider referral to neurology and rheumatology panel  Will follow up after labs

## 2015-06-03 NOTE — Progress Notes (Signed)
Pre visit review using our clinic review tool, if applicable. No additional management support is needed unless otherwise documented below in the visit note. 

## 2015-06-04 ENCOUNTER — Encounter: Payer: Self-pay | Admitting: Internal Medicine

## 2015-06-04 NOTE — Patient Instructions (Signed)
Paresthesia Paresthesia is an abnormal burning or prickling sensation. This sensation is generally felt in the hands, arms, legs, or feet. However, it may occur in any part of the body. Usually, it is not painful. The feeling may be described as:  Tingling or numbness.  Pins and needles.  Skin crawling.  Buzzing.  Limbs falling asleep.  Itching. Most people experience temporary (transient) paresthesia at some time in their lives. Paresthesia may occur when you breathe too quickly (hyperventilation). It can also occur without any apparent cause. Commonly, paresthesia occurs when pressure is placed on a nerve. The sensation quickly goes away after the pressure is removed. For some people, however, paresthesia is a long-lasting (chronic) condition that is caused by an underlying disorder. If you continue to have paresthesia, you may need further medical evaluation. HOME CARE INSTRUCTIONS Watch your condition for any changes. Taking the following actions may help to lessen any discomfort that you are feeling:  Avoid drinking alcohol.  Try acupuncture or massage to help relieve your symptoms.  Keep all follow-up visits as directed by your health care provider. This is important. SEEK MEDICAL CARE IF:  You continue to have episodes of paresthesia.  Your burning or prickling feeling gets worse when you walk.  You have pain, cramps, or dizziness.  You develop a rash. SEEK IMMEDIATE MEDICAL CARE IF:  You feel weak.  You have trouble walking or moving.  You have problems with speech, understanding, or vision.  You feel confused.  You cannot control your bladder or bowel movements.  You have numbness after an injury.  You faint.   This information is not intended to replace advice given to you by your health care provider. Make sure you discuss any questions you have with your health care provider.   Document Released: 12/22/2001 Document Revised: 05/18/2014 Document Reviewed:  12/28/2013 Elsevier Interactive Patient Education 2016 Elsevier Inc.  

## 2015-06-07 LAB — VITAMIN B12: Vitamin B-12: 524 pg/mL (ref 211–911)

## 2015-06-07 LAB — TSH: TSH: 2.63 u[IU]/mL (ref 0.35–4.50)

## 2015-06-07 LAB — H. PYLORI ANTIBODY, IGG: H Pylori IgG: NEGATIVE

## 2015-06-07 LAB — FOLATE: Folate: 11.1 ng/mL (ref >5.9)

## 2015-06-07 LAB — HEMOGLOBIN A1C: Hgb A1c MFr Bld: 5.2 % (ref 4.6–6.5)

## 2015-07-15 ENCOUNTER — Ambulatory Visit (INDEPENDENT_AMBULATORY_CARE_PROVIDER_SITE_OTHER): Payer: Managed Care, Other (non HMO) | Admitting: Physician Assistant

## 2015-07-15 VITALS — BP 104/60 | HR 80 | Temp 98.0°F | Resp 16 | Ht 63.5 in | Wt 165.8 lb

## 2015-07-15 DIAGNOSIS — B373 Candidiasis of vulva and vagina: Secondary | ICD-10-CM

## 2015-07-15 DIAGNOSIS — Z114 Encounter for screening for human immunodeficiency virus [HIV]: Secondary | ICD-10-CM

## 2015-07-15 DIAGNOSIS — Z113 Encounter for screening for infections with a predominantly sexual mode of transmission: Secondary | ICD-10-CM

## 2015-07-15 DIAGNOSIS — L298 Other pruritus: Secondary | ICD-10-CM

## 2015-07-15 DIAGNOSIS — Z118 Encounter for screening for other infectious and parasitic diseases: Secondary | ICD-10-CM

## 2015-07-15 DIAGNOSIS — R350 Frequency of micturition: Secondary | ICD-10-CM | POA: Diagnosis not present

## 2015-07-15 DIAGNOSIS — N898 Other specified noninflammatory disorders of vagina: Secondary | ICD-10-CM

## 2015-07-15 DIAGNOSIS — B3731 Acute candidiasis of vulva and vagina: Secondary | ICD-10-CM

## 2015-07-15 LAB — POC MICROSCOPIC URINALYSIS (UMFC): Mucus: ABSENT

## 2015-07-15 LAB — POCT WET + KOH PREP: Trich by wet prep: ABSENT

## 2015-07-15 MED ORDER — FLUCONAZOLE 150 MG PO TABS: 150.0000 mg | ORAL_TABLET | Freq: Once | ORAL | Status: DC

## 2015-07-15 NOTE — Patient Instructions (Addendum)
Fluconazole tablets What is this medicine? FLUCONAZOLE (floo KON na zole) is an antifungal medicine. It is used to treat certain kinds of fungal or yeast infections. This medicine may be used for other purposes; ask your health care provider or pharmacist if you have questions. What should I tell my health care provider before I take this medicine? They need to know if you have any of these conditions: -electrolyte abnormalities -history of irregular heart beat -kidney disease -an unusual or allergic reaction to fluconazole, other azole antifungals, medicines, foods, dyes, or preservatives -pregnant or trying to get pregnant -breast-feeding How should I use this medicine? Take this medicine by mouth. Follow the directions on the prescription label. Do not take your medicine more often than directed. Talk to your pediatrician regarding the use of this medicine in children. Special care may be needed. This medicine has been used in children as young as 58 months of age. Overdosage: If you think you have taken too much of this medicine contact a poison control center or emergency room at once. NOTE: This medicine is only for you. Do not share this medicine with others. What if I miss a dose? If you miss a dose, take it as soon as you can. If it is almost time for your next dose, take only that dose. Do not take double or extra doses. What may interact with this medicine? Do not take this medicine with any of the following medications: -astemizole -certain medicines for irregular heart beat like dofetilide, dronedarone, quinidine -cisapride -erythromycin -lomitapide -other medicines that prolong the QT interval (cause an abnormal heart rhythm) -pimozide -terfenadine -thioridazine -tolvaptan -ziprasidone This medicine may also interact with the following medications: -antiviral medicines for HIV or AIDS -birth control pills -certain antibiotics like rifabutin, rifampin -certain  medicines for blood pressure like amlodipine, isradipine, felodipine, hydrochlorothiazide, losartan, nifedipine -certain medicines for cancer like cyclophosphamide, vinblastine, vincristine -certain medicines for cholesterol like atorvastatin, lovastatin, fluvastatin, simvastatin -certain medicines for depression, anxiety, or psychotic disturbances like amitriptyline, midazolam, nortriptyline, triazolam -certain medicines for diabetes like glipizide, glyburide, tolbutamide -certain medicines for pain like alfentanil, fentanyl, methadone -certain medicines for seizures like carbamazepine, phenytoin -certain medicines that treat or prevent blood clots like warfarin -halofantrine -medicines that lower your chance of fighting infection like cyclosporine, prednisone, tacrolimus -NSAIDS, medicines for pain and inflammation, like celecoxib, diclofenac, flurbiprofen, ibuprofen, meloxicam, naproxen -other medicines for fungal infections -sirolimus -theophylline -tofacitinib This list may not describe all possible interactions. Give your health care provider a list of all the medicines, herbs, non-prescription drugs, or dietary supplements you use. Also tell them if you smoke, drink alcohol, or use illegal drugs. Some items may interact with your medicine. What should I watch for while using this medicine? Visit your doctor or health care professional for regular checkups. If you are taking this medicine for a long time you may need blood work. Tell your doctor if your symptoms do not improve. Some fungal infections need many weeks or months of treatment to cure. Alcohol can increase possible damage to your liver. Avoid alcoholic drinks. If you have a vaginal infection, do not have sex until you have finished your treatment. You can wear a sanitary napkin. Do not use tampons. Wear freshly washed cotton, not synthetic, panties. What side effects may I notice from receiving this medicine? Side effects that  you should report to your doctor or health care professional as soon as possible: -allergic reactions like skin rash or itching, hives, swelling of the lips, mouth,  tongue, or throat -dark urine -feeling dizzy or faint -irregular heartbeat or chest pain -redness, blistering, peeling or loosening of the skin, including inside the mouth -trouble breathing -unusual bruising or bleeding -vomiting -yellowing of the eyes or skin Side effects that usually do not require medical attention (report to your doctor or health care professional if they continue or are bothersome): -changes in how food tastes -diarrhea -headache -stomach upset or nausea This list may not describe all possible side effects. Call your doctor for medical advice about side effects. You may report side effects to FDA at 1-800-FDA-1088. Where should I keep my medicine? Keep out of the reach of children. Store at room temperature below 30 degrees C (86 degrees F). Throw away any medicine after the expiration date. NOTE: This sheet is a summary. It may not cover all possible information. If you have questions about this medicine, talk to your doctor, pharmacist, or health care provider.    2016, Elsevier/Gold Standard. (2012-08-09 16:13:04)  Monilial Vaginitis Vaginitis in a soreness, swelling and redness (inflammation) of the vagina and vulva. Monilial vaginitis is not a sexually transmitted infection. CAUSES  Yeast vaginitis is caused by yeast (candida) that is normally found in your vagina. With a yeast infection, the candida has overgrown in number to a point that upsets the chemical balance. SYMPTOMS   White, thick vaginal discharge.  Swelling, itching, redness and irritation of the vagina and possibly the lips of the vagina (vulva).  Burning or painful urination.  Painful intercourse. DIAGNOSIS  Things that may contribute to monilial vaginitis are:  Postmenopausal and virginal  states.  Pregnancy.  Infections.  Being tired, sick or stressed, especially if you had monilial vaginitis in the past.  Diabetes. Good control will help lower the chance.  Birth control pills.  Tight fitting garments.  Using bubble bath, feminine sprays, douches or deodorant tampons.  Taking certain medications that kill germs (antibiotics).  Sporadic recurrence can occur if you become ill. TREATMENT  Your caregiver will give you medication.  There are several kinds of anti monilial vaginal creams and suppositories specific for monilial vaginitis. For recurrent yeast infections, use a suppository or cream in the vagina 2 times a week, or as directed.  Anti-monilial or steroid cream for the itching or irritation of the vulva may also be used. Get your caregiver's permission.  Painting the vagina with methylene blue solution may help if the monilial cream does not work.  Eating yogurt may help prevent monilial vaginitis. HOME CARE INSTRUCTIONS   Finish all medication as prescribed.  Do not have sex until treatment is completed or after your caregiver tells you it is okay.  Take warm sitz baths.  Do not douche.  Do not use tampons, especially scented ones.  Wear cotton underwear.  Avoid tight pants and panty hose.  Tell your sexual partner that you have a yeast infection. They should go to their caregiver if they have symptoms such as mild rash or itching.  Your sexual partner should be treated as well if your infection is difficult to eliminate.  Practice safer sex. Use condoms.  Some vaginal medications cause latex condoms to fail. Vaginal medications that harm condoms are:  Cleocin cream.  Butoconazole (Femstat).  Terconazole (Terazol) vaginal suppository.  Miconazole (Monistat) (may be purchased over the counter). SEEK MEDICAL CARE IF:   You have a temperature by mouth above 102 F (38.9 C).  The infection is getting worse after 2 days of  treatment.  The infection is  not getting better after 3 days of treatment.  You develop blisters in or around your vagina.  You develop vaginal bleeding, and it is not your menstrual period.  You have pain when you urinate.  You develop intestinal problems.  You have pain with sexual intercourse.   This information is not intended to replace advice given to you by your health care provider. Make sure you discuss any questions you have with your health care provider.   Document Released: 10/11/2004 Document Revised: 03/26/2011 Document Reviewed: 07/05/2014 Elsevier Interactive Patient Education 2016 [employer].    IF you received an x-ray today, you will receive an invoice from [facility]. Please contact [facility] at [phone] with questions or concerns regarding your invoice.   IF you received labwork today, you will receive an invoice from [employer]. Please contact [facility] at [phone] with questions or concerns regarding your invoice.   Our billing staff will not be able to assist you with questions regarding bills from these companies.  You will be contacted with the lab results as soon as they are available. The fastest way to get your results is to activate your My Chart account. Instructions are located on the last page of this paperwork. If you have not heard from [place] regarding the results in 2 weeks, please contact this office.

## 2015-07-15 NOTE — Progress Notes (Signed)
Michelle Dudley  MRN: [id number] DOB: Jul 20, 1975  Subjective:  Michelle Dudley is a 40 y.o. female seen in office today for a chief complaint of vaginal itching x 1 week. More intense on the labia majora. Has had some cramping. ROS is positive for urinary frequency, decreased urine. Denies discharge, odor, vaginal pain, dyspareunia, dysuria, and hematuria. Has tried triamcinolone/ny statin cream with minimal relief. Has not recently had any antibiotics.   Pt has history of yeast infections but has not had one in a while. Has stopped using panty liners. She wears cotton underwear, and sleeps without underwear.   Pt has history of genital herpes and did have an outbreak within the past week and wondering if that could be contributing to the intensity of this issue.    Patient Active Problem List   Diagnosis Date Noted  . Allergy-induced asthma 01/06/2015  . Nulligravida 11/18/2013  . Female infertility 03/03/2012    Current Outpatient Prescriptions on File Prior to Visit  Medication Sig Dispense Refill  . albuterol (PROVENTIL HFA;VENTOLIN HFA) 108 (90 Base) MCG/ACT inhaler Inhale 1-2 puffs into the lungs every 6 (six) hours as needed for wheezing or shortness of breath. 1 Inhaler 1  . cholecalciferol (VITAMIN D) 1000 UNITS tablet Take 1,000 Units by mouth every other day.    . Multiple Vitamin (MULTIVITAMIN) tablet Take 1 tablet by mouth daily.     No current facility-administered medications on file prior to visit.    No Known Allergies  Review of Systems Objective:  BP 104/60 mmHg  Pulse 80  Temp(Src) 98 F (36.7 C) (Oral)  Resp 16  Ht 5' 3.5" (1.613 m)  Wt 165 lb 12.8 oz (75.206 kg)  BMI 28.91 kg/m2  SpO2 99%  LMP 06/27/2015  Physical Exam  Constitutional: She is oriented to person, place, and time and well-developed, well-nourished, and in no distress.  HENT:  Head: Normocephalic and atraumatic.  Eyes: Conjunctivae are normal.  Neck: Normal range of  motion.  Pulmonary/Chest: Effort normal.  Genitourinary: Uterus normal, cervix normal, right adnexa normal and left adnexa normal. Vulva exhibits no lesion. Rash: labia majora appear dry and erythematous, more pronounced inferiorly. Thick  creamy  odorless  white and vaginal discharge found.  Neurological: She is alert and oriented to person, place, and time. Gait normal.  Skin: Skin is warm and dry.  Psychiatric: Affect normal.  Vitals reviewed.  Results for orders placed or performed in visit on 07/15/15 (from the past 24 hour(s))  POCT Microscopic Urinalysis ([facility])     Status: Abnormal   Collection Time: 07/15/15  6:12 PM  Result Value Ref Range   WBC,UR,HPF,POC None None WBC/hpf   RBC,UR,HPF,POC None None RBC/hpf   Bacteria None None, Too numerous to count   Mucus Absent Absent   Epithelial Cells, UR Per Microscopy Moderate (A) None, Too numerous to count cells/hpf  POCT Wet + KOH Prep     Status: Abnormal   Collection Time: 07/15/15  6:12 PM  Result Value Ref Range   Yeast by KOH Present Present, Absent   Yeast by wet prep Present Present, Absent   WBC by wet prep Few None, Few, Too numerous to count   Clue Cells Wet Prep HPF POC None None, Too numerous to count   Trich by wet prep Absent Present, Absent   Bacteria Wet Prep HPF POC Many (A) None, Few, Too numerous to count   Epithelial Cells By [employer] Pref ([facility]) Many (A) None, Few, Too  numerous to count   RBC,UR,HPF,POC None None RBC/hpf     Assessment and Plan :  1. Vaginal itching - POCT Microscopic Urinalysis ([facility]) - POCT Wet + KOH Prep - GC/Chlamydia Probe Amp - HIV antibody - RPR  2. Urinary frequency - POCT Microscopic Urinalysis ([facility])  3. Screening for HIV (human immunodeficiency virus) - HIV antibody  4. Screen for STD (sexually transmitted disease) - RPR  5. Screening for chlamydial disease - GC/Chlamydia Probe Amp  6. Vulvovaginal candidiasis -POCT Wet + KOH Prep showed yeast  -Prescribed  fluconazole (DIFLUCAN) 150 MG tablet; Take 1 tablet (150 mg total) by mouth once.  Dispense: 1 tablet; Refill: 0; pt informed that this medication is contraindicated during pregnancy. Pt reports that she is not pregnant, her LMP was 06/27/15.  -Return to clinic if symptoms worsen, do not improve, or as needed.  Tenna Delaine PA-C  [facility] 07/16/2015 12:29 AM

## 2015-07-16 LAB — HIV ANTIBODY (ROUTINE TESTING W REFLEX): HIV 1&2 Ab, 4th Generation: NONREACTIVE

## 2015-07-18 LAB — GC/CHLAMYDIA PROBE AMP
CT Probe RNA: NOT DETECTED
GC Probe RNA: NOT DETECTED

## 2015-07-19 ENCOUNTER — Encounter: Payer: Self-pay | Admitting: Physician Assistant

## 2015-07-19 LAB — RPR

## 2015-07-20 ENCOUNTER — Encounter: Payer: Self-pay | Admitting: Physician Assistant

## 2015-08-13 ENCOUNTER — Encounter: Payer: Self-pay | Admitting: Internal Medicine

## 2015-08-16 ENCOUNTER — Ambulatory Visit (INDEPENDENT_AMBULATORY_CARE_PROVIDER_SITE_OTHER): Payer: Managed Care, Other (non HMO) | Admitting: Internal Medicine

## 2015-08-16 ENCOUNTER — Encounter: Payer: Self-pay | Admitting: Internal Medicine

## 2015-08-16 VITALS — BP 104/66 | HR 65 | Temp 98.5°F | Wt 160.5 lb

## 2015-08-16 DIAGNOSIS — H538 Other visual disturbances: Secondary | ICD-10-CM

## 2015-08-16 DIAGNOSIS — R519 Headache, unspecified: Secondary | ICD-10-CM

## 2015-08-16 DIAGNOSIS — R202 Paresthesia of skin: Secondary | ICD-10-CM | POA: Diagnosis not present

## 2015-08-16 DIAGNOSIS — R12 Heartburn: Secondary | ICD-10-CM

## 2015-08-16 DIAGNOSIS — R42 Dizziness and giddiness: Secondary | ICD-10-CM | POA: Diagnosis not present

## 2015-08-16 DIAGNOSIS — R51 Headache: Secondary | ICD-10-CM | POA: Diagnosis not present

## 2015-08-16 NOTE — Patient Instructions (Signed)
Food Choices for Gastroesophageal Reflux Disease, Adult When you have gastroesophageal reflux disease (GERD), the foods you eat and your eating habits are very important. Choosing the right foods can help ease the discomfort of GERD. WHAT GENERAL GUIDELINES DO I NEED TO FOLLOW?  Choose fruits, vegetables, whole grains, low-fat dairy products, and low-fat meat, fish, and poultry.  Limit fats such as oils, salad dressings, butter, nuts, and avocado.  Keep a food diary to identify foods that cause symptoms.  Avoid foods that cause reflux. These may be different for different people.  Eat frequent small meals instead of three large meals each day.  Eat your meals slowly, in a relaxed setting.  Limit fried foods.  Cook foods using methods other than frying.  Avoid drinking alcohol.  Avoid drinking large amounts of liquids with your meals.  Avoid bending over or lying down until 2-3 hours after eating. WHAT FOODS ARE NOT RECOMMENDED? The following are some foods and drinks that may worsen your symptoms: Vegetables Tomatoes. Tomato juice. Tomato and spaghetti sauce. Chili peppers. Onion and garlic. Horseradish. Fruits Oranges, grapefruit, and lemon (fruit and juice). Meats High-fat meats, fish, and poultry. This includes hot dogs, ribs, ham, sausage, salami, and bacon. Dairy Whole milk and chocolate milk. Sour cream. Cream. Butter. Ice cream. Cream cheese.  Beverages Coffee and tea, with or without caffeine. Carbonated beverages or energy drinks. Condiments Hot sauce. Barbecue sauce.  Sweets/Desserts Chocolate and cocoa. Donuts. Peppermint and spearmint. Fats and Oils High-fat foods, including French fries and potato chips. Other Vinegar. Strong spices, such as black pepper, white pepper, red pepper, cayenne, curry powder, cloves, ginger, and chili powder. The items listed above may not be a complete list of foods and beverages to avoid. Contact your dietitian for more  information.   This information is not intended to replace advice given to you by your health care provider. Make sure you discuss any questions you have with your health care provider.   Document Released: 01/01/2005 Document Revised: 01/22/2014 Document Reviewed: 11/05/2012 Elsevier Interactive Patient Education 2016 Elsevier Inc.  
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
## 2015-08-23 ENCOUNTER — Other Ambulatory Visit: Payer: Self-pay | Admitting: Gastroenterology

## 2015-08-23 DIAGNOSIS — R1084 Generalized abdominal pain: Secondary | ICD-10-CM

## 2015-08-29 ENCOUNTER — Ambulatory Visit
Admission: RE | Admit: 2015-08-29 | Discharge: 2015-08-29 | Disposition: A | Discharge status: Home / Self Care | Payer: Managed Care, Other (non HMO) | Source: Ambulatory Visit | Attending: Gastroenterology | Admitting: Gastroenterology

## 2015-08-29 DIAGNOSIS — R1084 Generalized abdominal pain: Secondary | ICD-10-CM

## 2015-08-29 MED ORDER — IOPAMIDOL (ISOVUE-300) INJECTION 61%
100.0000 mL | Freq: Once | INTRAVENOUS | Status: AC | PRN
  Administered 2015-08-29: 100 mL via INTRAVENOUS

## 2015-08-30 ENCOUNTER — Ambulatory Visit (INDEPENDENT_AMBULATORY_CARE_PROVIDER_SITE_OTHER): Payer: Managed Care, Other (non HMO) | Admitting: Neurology

## 2015-08-30 ENCOUNTER — Encounter: Payer: Self-pay | Admitting: Neurology

## 2015-08-30 VITALS — BP 95/58 | HR 80 | Ht 64.0 in | Wt 156.6 lb

## 2015-08-30 DIAGNOSIS — M542 Cervicalgia: Secondary | ICD-10-CM | POA: Diagnosis not present

## 2015-08-30 DIAGNOSIS — R51 Headache: Secondary | ICD-10-CM

## 2015-08-30 DIAGNOSIS — R202 Paresthesia of skin: Secondary | ICD-10-CM

## 2015-08-30 DIAGNOSIS — H539 Unspecified visual disturbance: Secondary | ICD-10-CM

## 2015-08-30 DIAGNOSIS — R110 Nausea: Secondary | ICD-10-CM | POA: Diagnosis not present

## 2015-08-30 DIAGNOSIS — G8929 Other chronic pain: Secondary | ICD-10-CM

## 2015-08-30 DIAGNOSIS — S0990XA Unspecified injury of head, initial encounter: Secondary | ICD-10-CM

## 2015-08-30 DIAGNOSIS — R42 Dizziness and giddiness: Secondary | ICD-10-CM | POA: Diagnosis not present

## 2015-08-30 DIAGNOSIS — R519 Headache, unspecified: Secondary | ICD-10-CM

## 2015-08-30 MED ORDER — TIZANIDINE HCL 2 MG PO CAPS: 2.0000 mg | ORAL_CAPSULE | Freq: Three times a day (TID) | ORAL | 3 refills | Status: DC | PRN
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
## 2015-08-30 NOTE — Patient Instructions (Addendum)
Remember to drink plenty of fluid, eat healthy meals and do not skip any meals. Try to eat protein with a every meal and eat a healthy snack such as fruit or nuts in between meals. Try to keep a regular sleep-wake schedule and try to exercise daily, particularly in the form of walking, 20-30 minutes a day, if you can.   As far as your medications are concerned, I would like to suggest Tizanidine up to 3x a day for headaches and neck tightness/pain  As far as diagnostic testing: MRI brain  Our phone number is [phone]. We also have an after hours call service for urgent matters and there is a physician on-call for urgent questions. For any emergencies you know to call 911 or go to the nearest emergency room  Tizanidine tablets or capsules What is this medicine? TIZANIDINE (tye ZAN i deen) helps to relieve muscle spasms. It may be used to help in the treatment of multiple sclerosis and spinal cord injury. This medicine may be used for other purposes; ask your health care provider or pharmacist if you have questions. What should I tell my health care provider before I take this medicine? They need to know if you have any of these conditions: -kidney disease -liver disease -low blood pressure -mental disorder -an unusual or allergic reaction to tizanidine, other medicines, lactose (tablets only), foods, dyes, or preservatives -pregnant or trying to get pregnant -breast-feeding How should I use this medicine? Take this medicine by mouth with a full glass of water. Take this medicine on an empty stomach, at least 30 minutes before or 2 hours after food. Do not take with food unless you talk with your doctor. Follow the directions on the prescription label. Take your medicine at regular intervals. Do not take your medicine more often than directed. Do not stop taking except on your doctor's advice. Suddenly stopping the medicine can be very dangerous. Talk to your pediatrician regarding the use  of this medicine in children. Patients over 38 years old may have a stronger reaction and need a smaller dose. Overdosage: If you think you have taken too much of this medicine contact a poison control center or emergency room at once. NOTE: This medicine is only for you. Do not share this medicine with others. What if I miss a dose? If you miss a dose, take it as soon as you can. If it is almost time for your next dose, take only that dose. Do not take double or extra doses. What may interact with this medicine? Do not take this medicine with any of the following medications: -ciprofloxacin -clonidine -fluvoxamine -guanabenz -guanfacine -methyldopa This medicine may also interact with the following medications: -acyclovir -alcohol -antihistamines -baclofen -barbiturates like phenobarbital -benzodiazepines -cimetidine -famotidine -female hormones, like estrogens or progestins and birth control pills -medicines for high blood pressure -medicines for irregular heartbeat -medicines for pain like codeine, morphine, and hydrocodone -medicines for sleep -rofecoxib -some antibiotics like levofloxacin, ofloxacin -ticlopidine -zileuton This list may not describe all possible interactions. Give your health care provider a list of all the medicines, herbs, non-prescription drugs, or dietary supplements you use. Also tell them if you smoke, drink alcohol, or use illegal drugs. Some items may interact with your medicine. What should I watch for while using this medicine? You may get drowsy or dizzy. Do not drive, use machinery, or do anything that needs mental alertness until you know how this medicine affects you. Do not stand or sit up quickly, especially  if you are an older patient. This reduces the risk of dizzy or fainting spells. Alcohol may interfere with the effect of this medicine. Avoid alcoholic drinks. Your mouth may get dry. Chewing sugarless gum or sucking hard candy, and drinking  plenty of water may help. Contact your doctor if the problem does not go away or is severe. What side effects may I notice from receiving this medicine? Side effects that you should report to your doctor or health care professional as soon as possible: -allergic reactions like skin rash, itching or hives, swelling of the face, lips, or tongue -blurred vision -fainting spells -hallucinations -nausea or vomiting -nervousness -redness, blistering, peeling or loosening of the skin, including inside the mouth -slow or irregular heartbeat, palpitations, or chest pain -yellowing of the skin or eyes Side effects that usually do not require medical attention (report to your doctor or health care professional if they continue or are bothersome): -dizziness -drowsiness -dry mouth -tiredness or weakness This list may not describe all possible side effects. Call your doctor for medical advice about side effects. You may report side effects to FDA at 1-800-FDA-1088. Where should I keep my medicine? Keep out of the reach of children. Store at room temperature between 15 and 30 degrees C (59 and 86 degrees F). Throw away any unused medicine after the expiration date. NOTE: This sheet is a summary. It may not cover all possible information. If you have questions about this medicine, talk to your doctor, pharmacist, or health care provider.    2016, Elsevier/Gold Standard. (2007-09-18 12:38:02)

## 2015-09-11 ENCOUNTER — Encounter: Payer: Self-pay | Admitting: Internal Medicine

## 2015-09-11 ENCOUNTER — Ambulatory Visit
Admission: RE | Admit: 2015-09-11 | Discharge: 2015-09-11 | Disposition: A | Discharge status: Home / Self Care | Payer: Managed Care, Other (non HMO) | Source: Ambulatory Visit | Attending: Neurology | Admitting: Neurology

## 2015-09-11 DIAGNOSIS — R51 Headache: Secondary | ICD-10-CM | POA: Diagnosis not present

## 2015-09-11 DIAGNOSIS — H539 Unspecified visual disturbance: Secondary | ICD-10-CM | POA: Diagnosis not present

## 2015-09-11 DIAGNOSIS — S0990XA Unspecified injury of head, initial encounter: Secondary | ICD-10-CM

## 2015-09-11 DIAGNOSIS — R42 Dizziness and giddiness: Secondary | ICD-10-CM

## 2015-09-11 DIAGNOSIS — R110 Nausea: Secondary | ICD-10-CM

## 2015-09-11 DIAGNOSIS — R202 Paresthesia of skin: Secondary | ICD-10-CM

## 2015-09-11 DIAGNOSIS — R519 Headache, unspecified: Secondary | ICD-10-CM

## 2015-09-11 MED ORDER — GADOBENATE DIMEGLUMINE 529 MG/ML IV SOLN
14.0000 mL | Freq: Once | INTRAVENOUS | Status: AC | PRN
  Administered 2015-09-11: 14 mL via INTRAVENOUS

## 2015-09-12 ENCOUNTER — Encounter: Payer: Self-pay | Admitting: Internal Medicine

## 2015-09-13 ENCOUNTER — Telehealth: Payer: Self-pay | Admitting: *Deleted

## 2015-09-13 NOTE — Telephone Encounter (Signed)
LVM for pt about normal MRI brain per Dr Jaynee Eagles note. Gave [facility] phone number if she has further questions.

## 2015-09-13 NOTE — Telephone Encounter (Signed)
-----   Message from Melvenia Beam, MD sent at 09/13/2015 12:32 PM EDT ----- MRI of the brain normal

## 2015-10-10 ENCOUNTER — Encounter: Payer: Self-pay | Admitting: Internal Medicine

## 2015-10-12 ENCOUNTER — Ambulatory Visit: Payer: Managed Care, Other (non HMO)

## 2015-11-15 ENCOUNTER — Ambulatory Visit (INDEPENDENT_AMBULATORY_CARE_PROVIDER_SITE_OTHER): Payer: Managed Care, Other (non HMO) | Admitting: Family Medicine

## 2015-11-15 ENCOUNTER — Encounter: Payer: Self-pay | Admitting: Family Medicine

## 2015-11-15 VITALS — BP 110/70 | HR 98 | Temp 98.1°F | Resp 12 | Ht 64.0 in | Wt 160.2 lb

## 2015-11-15 DIAGNOSIS — R200 Anesthesia of skin: Secondary | ICD-10-CM

## 2015-11-15 DIAGNOSIS — R202 Paresthesia of skin: Secondary | ICD-10-CM

## 2015-11-15 DIAGNOSIS — L503 Dermatographic urticaria: Secondary | ICD-10-CM

## 2015-11-15 DIAGNOSIS — D649 Anemia, unspecified: Secondary | ICD-10-CM | POA: Diagnosis not present

## 2015-11-15 DIAGNOSIS — R208 Other disturbances of skin sensation: Secondary | ICD-10-CM

## 2015-11-15 NOTE — Progress Notes (Signed)
Pre visit review using our clinic review tool, if applicable. No additional management support is needed unless otherwise documented below in the visit note. 

## 2015-11-15 NOTE — Progress Notes (Signed)
HPI:  ACUTE VISIT:  Chief Complaint  Patient presents with  . burning skin    Michelle Dudley is a 40 y.o. female, who is here today with husband complaining of 2 months of skin burning sensation , "everywhere" and intermittent erythematous rash, sometimes hives.  Symptoms are not always at the same time but she thinks they are related. Raised erythematosus rash seems to be exacerbated by minor trauma like scratching skin.  She is also reporting macular erythematosus rash on areas she is having the burning sensation, which is intermittent, she has not identified exacerbating factors, husband states that sometimes noted redness on body after shower. Alleviated by Benadryl (rash and burning). Now she is burning on upper chest and neck.  Symptoms are stable, it happens a few times during the day,last a few minutes. She denies associated chills, fever, abnormal weight loss, cough, wheezing, stridor, dyspnea, arthralgias, myalgias, or focal weakness. She has not noted odynophagia, dysphagia, nausea, or vomiting.  She wonders if symptoms are related to dye when she had brain MRI or abdominal/pelvic CT, Protonix for GERD, or antibiotic treatment she received for insect bite a couple months ago. She stopped Protonix but burning sensation didn't seem to change.  She has Hx of allergic rhinitis, she is not on daily antihistaminic.     Lab Results  Component Value Date   [id number] 782 06/06/2015   Lab Results  Component Value Date   TSH 2.63 06/06/2015   Lab Results  Component Value Date   WBC 5.9 01/28/2015   HGB 12.0 01/28/2015   HCT 38.7 01/28/2015   MCV 80.1 01/28/2015   PLT 326.0 01/28/2015   Lab Results  Component Value Date   CREATININE 0.89 01/28/2015   BUN 8 01/28/2015   NA 138 01/28/2015   K 4.1 01/28/2015   CL 106 01/28/2015   CO2 26 01/28/2015   07/15/15 RPR and HIV NR.  Since December 2016 she has had intermittent numbness and tingling sensation on  different areas of her body, which she has attributed to "digestive problems", stable otherwise. She has followed with neurologists, OV 08/30/15, Hx of migraine.   According to patient, she was not having burning sensation at the time of this visit, she is reporting that headaches have resolved. She was also having "muscle spasms", has not had any for 3 weeks.   GI did labs 2-3 weeks ago. Planning on colonoscopy because worsening anemia. He is reporting history of heavy menstrual periods and beta thalassemia minor. She is not on iron supplementation.  At the GI office she had CBC, CMP, and ESR. H/H 10.9/35.2. ESR 15. CMP normal.   Brain MRI 09/12/15:This is a normal MRI of the brain with and without contrast.  08/29/15 abd/pelvic CT: Fibroid uterus. Moderate stool burden in the colon.  No acute findings in the abdomen or pelvis.   She denies any sleep problems, anxiety, or depression. She feels frustrated because of this new health issues she has had since December 2016.  In general the problem is stable but she is frustrated because he has not resolved. No sick contact. She was in [place] 09/2015 but she was already having symptoms.    Review of Systems  Constitutional: Negative for appetite change, chills, fatigue, fever and unexpected weight change.  HENT: Negative for congestion, facial swelling, mouth sores, nosebleeds, sinus pressure, sore throat, trouble swallowing and voice change.   Eyes: Negative for discharge, redness and visual disturbance.  Respiratory: Negative for cough,  shortness of breath, wheezing and stridor.   Cardiovascular: Negative for chest pain, palpitations and leg swelling.  Gastrointestinal: Positive for constipation. Negative for abdominal pain, blood in stool, nausea and vomiting.       No changes in bowel habits.  Endocrine: Negative for cold intolerance, heat intolerance, polydipsia, polyphagia and polyuria.  Genitourinary: Negative for decreased  urine volume, difficulty urinating, frequency and hematuria.  Musculoskeletal: Negative for arthralgias, back pain, gait problem, myalgias and neck pain.  Skin: Positive for rash. Negative for wound.  Allergic/Immunologic: Positive for environmental allergies. Negative for food allergies.  Neurological: Positive for numbness. Negative for tremors, seizures, syncope, facial asymmetry, speech difficulty, weakness and headaches.  Hematological: Negative for adenopathy. Does not bruise/bleed easily.  Psychiatric/Behavioral: Negative for confusion and sleep disturbance. The patient is nervous/anxious.       Current Outpatient Prescriptions on File Prior to Visit  Medication Sig Dispense Refill  . albuterol (PROVENTIL HFA;VENTOLIN HFA) 108 (90 Base) MCG/ACT inhaler Inhale 1-2 puffs into the lungs every 6 (six) hours as needed for wheezing or shortness of breath. 1 Inhaler 1  . cholecalciferol (VITAMIN D) 1000 UNITS tablet Take 1,000 Units by mouth every other day.    . Multiple Vitamin (MULTIVITAMIN) tablet Take 1 tablet by mouth daily.     No current facility-administered medications on file prior to visit.      Past Medical History:  Diagnosis Date  . Allergy-induced asthma    Allergies  Allergen Reactions  . Other     Cats  . Permethrin     Social History   Social History  . Marital status: Married    Spouse name: Gerald Stabs   . Number of children: 0  . Years of education: PhD   Occupational History  . [employer]    Social History Main Topics  . Smoking status: Never Smoker  . Smokeless tobacco: Never Used  . Alcohol use 0.0 oz/week     Comment: 1 drink per month  . Drug use: No  . Sexual activity: Yes    Partners: Male    Birth control/ protection: None     Comment: One partner for the past ten years   Other Topics Concern  . None   Social History Narrative   Lives with husband   Caffeine use: Very rarely- tea        Vitals:   11/15/15 1540  BP:  110/70  Pulse: 98  Resp: 12  Temp: 98.1 F (36.7 C)    O2 sat at RA 95%.  Body mass index is 27.51 kg/m.    Physical Exam  Nursing note and vitals reviewed. Constitutional: She is oriented to person, place, and time. She appears well-developed and well-nourished. She does not appear ill. No distress.  HENT:  Head: Atraumatic.  Mouth/Throat: Oropharynx is clear and moist and mucous membranes are normal.  Eyes: Conjunctivae and EOM are normal. Pupils are equal, round, and reactive to light.  Neck: No muscular tenderness present. No tracheal deviation, no edema and no erythema present. No thyroid mass and no thyromegaly present.  Cardiovascular: Normal rate and regular rhythm.   No murmur heard. Pulses:      Dorsalis pedis pulses are 2+ on the right side, and 2+ on the left side.  Respiratory: Effort normal and breath sounds normal. No stridor. No respiratory distress.  GI: Soft. She exhibits no mass. There is no hepatomegaly. There is no tenderness.  Musculoskeletal: She exhibits no edema or tenderness.  No  significant deformity or signs of synovitis appreciated. No tenderness upon palpation of paraspinal muscles.  Lymphadenopathy:    She has no cervical adenopathy.       Right: No supraclavicular adenopathy present.       Left: No supraclavicular adenopathy present.  Neurological: She is alert and oriented to person, place, and time. She has normal strength. No cranial nerve deficit or sensory deficit. Coordination and gait normal.  Skin: Skin is warm. No ecchymosis and no rash noted. No erythema.  Psychiatric: Her mood appears anxious. Her affect is labile (at times during OV). Cognition and memory are normal.  Well groomed, good eye contact.      ASSESSMENT AND PLAN:     Nainika was seen today for burning skin.  Diagnoses and all orders for this visit:    Burning sensation of skin  We discussed possible causes. He seems to be related to the rash,  urticaria. Clearly instructed about warning signs. Further recommendations would be given according to lab results. Follow-up with PCP in 3-4 weeks.   -     TSH -     Cancel: Basic Metabolic Panel -     Cancel: CBC  Urticaria, dermatographic  Some of the characteristics of rash she is describing suggest dermatographic urticaria, it was not reproducible during visit today. I recommended OTC Zyrtec 10 mg daily. Instructed about warning signs. Follow-up with PCP in 3-4 weeks.  Numbness and tingling sensation of skin  Neurologic examination today is negative, headache has improved. She has followed with neurologist and has had brain MRI. If symptoms persist medications like Cymbalta, Gabapentin, or Lyrica can be tried. Monitor for new associated symptoms.  Anemia, unspecified type  Planning on having colonoscopy with GI. He Was related to dysfunctional uterine bleeding, history of fibroids. Further recommendations will be given according to lab results.  -     Ferritin     Return in about 4 weeks (around 12/13/2015) for 3-4 weeks with her PCP.     -Ms.Lulamae Skorupski Settlemyre advised to return or notify a doctor immediately if symptoms worsen or new concerns arise.       Betty G. Martinique, MD  [facility]. [place] office.

## 2015-11-15 NOTE — Patient Instructions (Signed)
  [name] I have seen you today for an acute visit.  1. Burning sensation of skin  - TSH - Basic Metabolic Panel - CBC  2. Urticaria, dermatographic   3. Numbness and tingling sensation of skin  Some labs done in the past, this year, has been normal.  Symptoms could be related to urticaria, I recommend Zyrtec 10 mg daily.     In general please monitor for signs of worsening symptoms and seek immediate medical attention if any concerning/warning symptom as we discussed.  Follow in 3-4 weeks, before if worsening.   Please be sure you have an appointment already scheduled with your PCP before you leave today.

## 2015-11-16 ENCOUNTER — Encounter: Payer: Self-pay | Admitting: Family Medicine

## 2015-11-16 LAB — TSH: TSH: 2.31 u[IU]/mL (ref 0.35–4.50)

## 2015-11-16 LAB — FERRITIN: Ferritin: 6.4 ng/mL — ABNORMAL LOW (ref 10.0–291.0)

## 2015-11-23 ENCOUNTER — Encounter: Payer: Self-pay | Admitting: Internal Medicine

## 2015-12-09 ENCOUNTER — Ambulatory Visit (INDEPENDENT_AMBULATORY_CARE_PROVIDER_SITE_OTHER): Payer: Managed Care, Other (non HMO) | Admitting: Urgent Care

## 2015-12-09 VITALS — BP 102/64 | HR 90 | Temp 98.5°F | Resp 16 | Ht 64.0 in | Wt 158.0 lb

## 2015-12-09 DIAGNOSIS — M79644 Pain in right finger(s): Secondary | ICD-10-CM

## 2015-12-09 DIAGNOSIS — Z23 Encounter for immunization: Secondary | ICD-10-CM | POA: Diagnosis not present

## 2015-12-09 DIAGNOSIS — S61316A Laceration without foreign body of right little finger with damage to nail, initial encounter: Secondary | ICD-10-CM

## 2015-12-09 DIAGNOSIS — F411 Generalized anxiety disorder: Secondary | ICD-10-CM

## 2015-12-09 MED ORDER — ALPRAZOLAM 0.25 MG PO TABS
0.5000 mg | ORAL_TABLET | Freq: Once | ORAL | Status: AC
  Administered 2015-12-09: 0.5 mg via ORAL

## 2015-12-09 NOTE — Progress Notes (Signed)
    MRN: [id number] DOB: [birth year]  Subjective:   Michelle Dudley is a 40 y.o. female presenting for chief complaint of Hand Injury (right pinky cut at home yesterday )  Reports suffering a laceration to her right pinky. Patient rinsed off her wound, used peroxide, covered it with gauze. Changed it again today, still had some bleeding. Currently, reports pain. Denies fever, redness, swelling. TDAP was last 02/17/2010.  Michelle Dudley has a current medication list which includes the following prescription(s): albuterol, cetirizine, cholecalciferol, and multivitamin. Also is allergic to other and permethrin.  Michelle Dudley  has a past medical history of Allergy-induced asthma. Also  has a past surgical history that includes Wisdom tooth extraction.   Objective:   Vitals: BP 102/64 (BP Location: Right Arm, Patient Position: Sitting, Cuff Size: Small)   Pulse 90   Temp 98.5 F (36.9 C) (Oral)   Resp 16   Ht 5\' 4"  (1.626 m)   Wt 158 lb (71.7 kg)   LMP 11/18/2015   SpO2 100%   BMI 27.12 kg/m   Physical Exam  Constitutional: She is oriented to person, place, and time. She appears well-developed and well-nourished.  Cardiovascular: Normal rate.   Pulmonary/Chest: Effort normal.  Musculoskeletal:       Right hand: She exhibits tenderness (over her 5th finger laceration), laceration and swelling (trace). She exhibits normal range of motion, no bony tenderness, normal two-point discrimination, normal capillary refill and no deformity. Normal sensation noted. Normal strength noted.       Hands: Neurological: She is alert and oriented to person, place, and time.   PROCEDURE NOTE: laceration repair Verbal consent obtained from patient.  Local anesthesia with 5cc 2% lidocaine mixed with 0.5% bupivacaine in 1:1 ratio.  Wound explored for tendon, ligament damage. Wound scrubbed with soap and water and rinsed. Wound closed with #4 5-0 Ethilon (simple interrupted) sutures. Wound cleansed and dressed.    Assessment and Plan :   1. Need for prophylactic vaccination and inoculation against influenza - Flu Vaccine QUAD 36+ mos PF IM (Fluarix & Fluzone Quad PF)  2. Laceration of right little finger without foreign body with damage to nail, initial encounter 3. Pain of finger of right hand - RTC in 10 days for suture removal. Wound care reviewed.   4. Anxiety state - ALPRAZolam (XANAX) tablet 0.5 mg; Take 2 tablets (0.5 mg total) by mouth once.   Michelle Eagles, PA-C [facility] [phone] 12/09/2015 4:14 PM

## 2015-12-09 NOTE — Patient Instructions (Addendum)
Please set up an appointment with PA-Brittany Timmothy Euler for suture removal on 12/19/2015.    Laceration Care, Adult Introduction A laceration is a cut that goes through all layers of the skin. The cut also goes into the tissue that is right under the skin. Some cuts heal on their own. Others need to be closed with stitches (sutures), staples, skin adhesive strips, or wound glue. Taking care of your cut lowers your risk of infection and helps your cut to heal better. How to take care of your cut For stitches or staples:  Keep the wound clean and dry.  If you were given a bandage (dressing), you should change it at least one time per day or as told by your doctor. You should also change it if it gets wet or dirty.  Keep the wound completely dry for the first 24 hours or as told by your doctor. After that time, you may take a shower or a bath. However, make sure that the wound is not soaked in water until after the stitches or staples have been removed.  Clean the wound one time each day or as told by your doctor:  Wash the wound with soap and water.  Rinse the wound with water until all of the soap comes off.  Pat the wound dry with a clean towel. Do not rub the wound.  After you clean the wound, put a thin layer of antibiotic ointment on it as told by your doctor. This ointment:  Helps to prevent infection.  Keeps the bandage from sticking to the wound.  Have your stitches or staples removed as told by your doctor. If your doctor used skin adhesive strips:  Keep the wound clean and dry.  If you were given a bandage, you should change it at least one time per day or as told by your doctor. You should also change it if it gets dirty or wet.  Do not get the skin adhesive strips wet. You can take a shower or a bath, but be careful to keep the wound dry.  If the wound gets wet, pat it dry with a clean towel. Do not rub the wound.  Skin adhesive strips fall off on their own. You can  trim the strips as the wound heals. Do not remove any strips that are still stuck to the wound. They will fall off after a while. If your doctor used wound glue:  Try to keep your wound dry, but you may briefly wet it in the shower or bath. Do not soak the wound in water, such as by swimming.  After you take a shower or a bath, gently pat the wound dry with a clean towel. Do not rub the wound.  Do not do any activities that will make you really sweaty until the skin glue has fallen off on its own.  Do not apply liquid, cream, or ointment medicine to your wound while the skin glue is still on.  If you were given a bandage, you should change it at least one time per day or as told by your doctor. You should also change it if it gets dirty or wet.  If a bandage is placed over the wound, do not let the tape for the bandage touch the skin glue.  Do not pick at the glue. The skin glue usually stays on for 5-10 days. Then, it falls off of the skin. General Instructions  To help prevent scarring, make sure to cover your  wound with sunscreen whenever you are outside after stitches are removed, after adhesive strips are removed, or when wound glue stays in place and the wound is healed. Make sure to wear a sunscreen of at least 30 SPF.  Take over-the-counter and prescription medicines only as told by your doctor.  If you were given antibiotic medicine or ointment, take or apply it as told by your doctor. Do not stop using the antibiotic even if your wound is getting better.  Do not scratch or pick at the wound.  Keep all follow-up visits as told by your doctor. This is important.  Check your wound every day for signs of infection. Watch for:  Redness, swelling, or pain.  Fluid, blood, or pus.  Raise (elevate) the injured area above the level of your heart while you are sitting or lying down, if possible. Get help if:  You got a tetanus shot and you have any of these problems at the  injection site:  Swelling.  Very bad pain.  Redness.  Bleeding.  You have a fever.  A wound that was closed breaks open.  You notice a bad smell coming from your wound or your bandage.  You notice something coming out of the wound, such as wood or glass.  Medicine does not help your pain.  You have more redness, swelling, or pain at the site of your wound.  You have fluid, blood, or pus coming from your wound.  You notice a change in the color of your skin near your wound.  You need to change the bandage often because fluid, blood, or pus is coming from the wound.  You start to have a new rash.  You start to have numbness around the wound. Get help right away if:  You have very bad swelling around the wound.  Your pain suddenly gets worse and is very bad.  You notice painful lumps near the wound or on skin that is anywhere on your body.  You have a red streak going away from your wound.  The wound is on your hand or foot and you cannot move a finger or toe like you usually can.  The wound is on your hand or foot and you notice that your fingers or toes look pale or bluish. This information is not intended to replace advice given to you by your health care provider. Make sure you discuss any questions you have with your health care provider. Document Released: 06/20/2007 Document Revised: 06/09/2015 Document Reviewed: 12/28/2013  2017 Elsevier     IF you received an x-ray today, you will receive an invoice from [facility]. Please contact [facility] at [phone] with questions or concerns regarding your invoice.   IF you received labwork today, you will receive an invoice from [employer]. Please contact [facility] at [phone] with questions or concerns regarding your invoice.   Our billing staff will not be able to assist you with questions regarding bills from these companies.  You will be contacted with the  lab results as soon as they are available. The fastest way to get your results is to activate your My Chart account. Instructions are located on the last page of this paperwork. If you have not heard from [place] regarding the results in 2 weeks, please contact this office.

## 2015-12-10 ENCOUNTER — Telehealth: Payer: Self-pay

## 2015-12-10 NOTE — Telephone Encounter (Signed)
Pt is needing to talk with someone about her visit on 12/09/15

## 2015-12-12 NOTE — Telephone Encounter (Signed)
Attempted to call pt, left VM for pt to call back  

## 2015-12-13 ENCOUNTER — Ambulatory Visit (INDEPENDENT_AMBULATORY_CARE_PROVIDER_SITE_OTHER): Payer: Managed Care, Other (non HMO) | Admitting: Family Medicine

## 2015-12-13 VITALS — BP 98/60 | HR 82 | Temp 98.7°F | Resp 16 | Ht 63.5 in | Wt 158.4 lb

## 2015-12-13 DIAGNOSIS — Z4889 Encounter for other specified surgical aftercare: Secondary | ICD-10-CM

## 2015-12-13 DIAGNOSIS — S61316D Laceration without foreign body of right little finger with damage to nail, subsequent encounter: Secondary | ICD-10-CM | POA: Diagnosis not present

## 2015-12-13 NOTE — Patient Instructions (Addendum)
IF you received an x-ray today, you will receive an invoice from [facility]. Please contact [facility] at [phone] with questions or concerns regarding your invoice.   IF you received labwork today, you will receive an invoice from [employer]. Please contact [facility] at [phone] with questions or concerns regarding your invoice.   Our billing staff will not be able to assist you with questions regarding bills from these companies.  You will be contacted with the lab results as soon as they are available. The fastest way to get your results is to activate your My Chart account. Instructions are located on the last page of this paperwork. If you have not heard from [place] regarding the results in 2 weeks, please contact this office.     Sutured Wound Care Sutures are stitches that can be used to close wounds. Taking care of your wound properly can help to prevent pain and infection. It can also help your wound to heal more quickly. How is this treated? Wound Care  Keep the wound clean and dry.  If you were given a bandage (dressing), you should change it at least once per day or as directed by your health care provider. You should also change it if it becomes wet or dirty.  Keep the wound completely dry for the first 24 hours or as directed by your health care provider. After that time, you may shower or bathe. However, make sure that the wound is not soaked in water until the sutures have been removed.  Clean the wound one time each day or as directed by your health care provider.  Wash the wound with soap and water.  Rinse the wound with water to remove all soap.  Pat the wound dry with a clean towel. Do not rub the wound.  Aftercleaning the wound, apply a thin layer of antibioticointment as directed by your health care provider. This will help to prevent infection and keep the dressing from sticking to the wound.  Have the  sutures removed as directed by your health care provider. General Instructions  Take or apply medicines only as directed by your health care provider.  To help prevent scarring, make sure to cover your wound with sunscreen whenever you are outside after the sutures are removed and the wound is healed. Make sure to wear a sunscreen of at least 30 SPF.  If you were prescribed an antibiotic medicine or ointment, finish all of it even if you start to feel better.  Do not scratch or pick at the wound.  Keep all follow-up visits as directed by your health care provider. This is important.  Check your wound every day for signs of infection. Watch for:  Redness, swelling, or pain.  Fluid, blood, or pus.  Raise (elevate) the injured area above the level of your heart while you are sitting or lying down, if possible.  Avoid stretching your wound.  Drink enough fluids to keep your urine clear or pale yellow. Contact a health care provider if:  You received a tetanus shot and you have swelling, severe pain, redness, or bleeding at the injection site.  You have a fever.  A wound that was closed breaks open.  You notice a bad smell coming from the wound.  You notice something coming out of the wound, such as wood or glass.  Your pain is not controlled with medicine.  You have increased redness, swelling, or pain at the site of  your wound.  You have fluid, blood, or pus coming from your wound.  You notice a change in the color of your skin near your wound.  You need to change the dressing frequently due to fluid, blood, or pus draining from the wound.  You develop a new rash.  You develop numbness around the wound. Get help right away if:  You develop severe swelling around the injury site.  Your pain suddenly increases and is severe.  You develop painful lumps near the wound or on skin that is anywhere on your body.  You have a red streak going away from your wound.  The  wound is on your hand or foot and you cannot properly move a finger or toe.  The wound is on your hand or foot and you notice that your fingers or toes look pale or bluish. This information is not intended to replace advice given to you by your health care provider. Make sure you discuss any questions you have with your health care provider. Document Released: 02/09/2004 Document Revised: 06/09/2015 Document Reviewed: 08/13/2012 Elsevier Interactive Patient Education  2017 [employer].

## 2015-12-13 NOTE — Progress Notes (Signed)
  Chief Complaint  Patient presents with  . Follow-up    RIGHT 5TH FINGER, SUTURES IN ON  12/10/15    HPI   Pt here for suture check She had sutures to her 5th digit of the right hand States that her finger throbs worse at night She reports that she can bend the finger and denies any purulent drainage   Past Medical History:  Diagnosis Date  . Allergy-induced asthma     Current Outpatient Prescriptions  Medication Sig Dispense Refill  . albuterol (PROVENTIL HFA;VENTOLIN HFA) 108 (90 Base) MCG/ACT inhaler Inhale 1-2 puffs into the lungs every 6 (six) hours as needed for wheezing or shortness of breath. 1 Inhaler 1  . cetirizine (ZYRTEC) 10 MG tablet Take 10 mg by mouth daily.    . cholecalciferol (VITAMIN D) 1000 UNITS tablet Take 1,000 Units by mouth every other day.    . Multiple Vitamin (MULTIVITAMIN) tablet Take 1 tablet by mouth daily.     No current facility-administered medications for this visit.     Allergies:  Allergies  Allergen Reactions  . Other     Cats  . Permethrin     Past Surgical History:  Procedure Laterality Date  . WISDOM TOOTH EXTRACTION      Social History   Social History  . Marital status: Married    Spouse name: Gerald Stabs   . Number of children: 0  . Years of education: PhD   Occupational History  . [employer]    Social History Main Topics  . Smoking status: Never Smoker  . Smokeless tobacco: Never Used  . Alcohol use 0.0 oz/week     Comment: 1 drink per month  . Drug use: No  . Sexual activity: Yes    Partners: Male    Birth control/ protection: None     Comment: One partner for the past ten years   Other Topics Concern  . None   Social History Narrative   Lives with husband   Caffeine use: Very rarely- tea        ROS  Objective: Vitals:   12/13/15 1743  BP: 98/60  Pulse: 82  Resp: 16  Temp: 98.7 F (37.1 C)  TempSrc: Oral  SpO2: 100%  Weight: 158 lb 6.4 oz (71.8 kg)  Height: 5' 3.5" (1.613 m)     Physical Exam   Cap refill <2s on the 5th digit Hematoma noted 4 sutures noted in a diamond pattern over wound that is clean   Assessment and Plan Michelle Dudley was seen today for follow-up.  Diagnoses and all orders for this visit:  Laceration of right little finger without foreign body with damage to nail, subsequent encounter  Suture check   Wound healing well Follow up December 4 for suture removal Advised elevation of the right hand at rest Discussed proper wound care   [place]

## 2015-12-16 NOTE — Telephone Encounter (Signed)
Pt RTC on 11/28 to be seen.

## 2015-12-19 ENCOUNTER — Ambulatory Visit (INDEPENDENT_AMBULATORY_CARE_PROVIDER_SITE_OTHER): Payer: Managed Care, Other (non HMO) | Admitting: Physician Assistant

## 2015-12-19 VITALS — BP 122/80 | HR 81 | Temp 98.1°F | Resp 16 | Ht 64.0 in | Wt 160.0 lb

## 2015-12-19 DIAGNOSIS — Z4802 Encounter for removal of sutures: Secondary | ICD-10-CM

## 2015-12-19 NOTE — Patient Instructions (Signed)
     IF you received an x-ray today, you will receive an invoice from [facility]. Please contact [facility] at [phone] with questions or concerns regarding your invoice.   IF you received labwork today, you will receive an invoice from [facility]. Please contact [facility] at [phone] with questions or concerns regarding your invoice.   Our billing staff will not be able to assist you with questions regarding bills from these companies.  You will be contacted with the lab results as soon as they are available. The fastest way to get your results is to activate your My Chart account. Instructions are located on the last page of this paperwork. If you have not heard from us regarding the results in 2 weeks, please contact this office.      

## 2015-12-20 NOTE — Progress Notes (Signed)
   12/20/2015 9:35 AM   DOB: [birth year] / MRN: [id number]  SUBJECTIVE:  Ms. Forbess is a well appearing 40 y.o. here today for wound care. She denies exquisite tenderness at the site of the wound, nausea, emesis, fever and chills.  She has been compliant with medical therapy and recommendations thus far.   She is allergic to other and permethrin.   She  has a past medical history of Allergy-induced asthma.    She  reports that she has never smoked. She has never used smokeless tobacco. She reports that she drinks alcohol. She reports that she does not use drugs. She  reports that she currently engages in sexual activity and has had female partners. She reports using the following method of birth control/protection: None. The patient  has a past surgical history that includes Wisdom tooth extraction.  Her family history includes Cancer in her maternal aunt; Diabetes in her brother, father, and maternal grandmother; Hypertension in her brother, father, and mother; Kidney disease in her father; Uterine cancer in her maternal aunt.  ROS  Per HPI  Problem list and medications reviewed and updated by myself where necessary, and exist elsewhere in the encounter.   OBJECTIVE:  BP 122/80 (BP Location: Right Arm, Patient Position: Sitting, Cuff Size: Normal)   Pulse 81   Temp 98.1 F (36.7 C) (Oral)   Resp 16   Ht 5\' 4"  (1.626 m)   Wt 160 lb (72.6 kg)   LMP 12/13/2015   SpO2 100%   BMI 27.46 kg/m  CrCl cannot be calculated (Patient's most recent lab result is older than the maximum 21 days allowed.).  Physical Exam  Constitutional: She is oriented to person, place, and time. She appears well-developed and well-nourished.  Cardiovascular: Normal rate and regular rhythm.   Pulmonary/Chest: Effort normal.  Musculoskeletal:       Right hand: She exhibits tenderness (over her 5th finger laceration), laceration and swelling (trace). She exhibits normal range of motion, no bony tenderness, normal  two-point discrimination, normal capillary refill and no deformity. Normal sensation noted. Normal strength noted.       Hands: Neurological: She is alert and oriented to person, place, and time.    No results found for this or any previous visit (from the past 48 hour(s)).  ASSESSMENT AND PLAN  Clairessa was seen today for suture / staple removal.  Diagnoses and all orders for this visit:  Encounter for removal of sutures: Removed without difficulty.     The patient was advised to call or return to clinic if she does not see an improvement in symptoms or to seek the care of the closest emergency department if she worsens with the above plan.   Philis Fendt, MHS, PA-C [facility] 12/20/2015 9:35 AM

## 2016-01-11 ENCOUNTER — Encounter: Payer: Self-pay | Admitting: Internal Medicine

## 2016-01-30 ENCOUNTER — Encounter: Payer: Self-pay | Admitting: Family Medicine

## 2016-05-16 ENCOUNTER — Emergency Department: Payer: Managed Care, Other (non HMO)

## 2016-05-16 ENCOUNTER — Emergency Department
Admission: EM | Admit: 2016-05-16 | Discharge: 2016-05-16 | Disposition: A | Discharge status: Home / Self Care | Payer: Managed Care, Other (non HMO) | Attending: Emergency Medicine | Admitting: Emergency Medicine

## 2016-05-16 ENCOUNTER — Encounter: Payer: Self-pay | Admitting: Emergency Medicine

## 2016-05-16 DIAGNOSIS — R519 Headache, unspecified: Secondary | ICD-10-CM

## 2016-05-16 DIAGNOSIS — R0789 Other chest pain: Secondary | ICD-10-CM | POA: Insufficient documentation

## 2016-05-16 DIAGNOSIS — Z79899 Other long term (current) drug therapy: Secondary | ICD-10-CM | POA: Insufficient documentation

## 2016-05-16 DIAGNOSIS — R51 Headache: Secondary | ICD-10-CM | POA: Diagnosis not present

## 2016-05-16 DIAGNOSIS — J45909 Unspecified asthma, uncomplicated: Secondary | ICD-10-CM | POA: Insufficient documentation

## 2016-05-16 DIAGNOSIS — R0602 Shortness of breath: Secondary | ICD-10-CM | POA: Diagnosis not present

## 2016-05-16 DIAGNOSIS — R200 Anesthesia of skin: Secondary | ICD-10-CM | POA: Diagnosis present

## 2016-05-16 LAB — BASIC METABOLIC PANEL
Anion gap: 5 (ref 5–15)
BUN: 10 mg/dL (ref 6–20)
CO2: 25 mmol/L (ref 22–32)
Calcium: 8.7 mg/dL — ABNORMAL LOW (ref 8.9–10.3)
Chloride: 110 mmol/L (ref 101–111)
Creatinine, Ser: 0.83 mg/dL (ref 0.44–1.00)
GFR calc Af Amer: >60 mL/min (ref >60)
GFR calc non Af Amer: >60 mL/min (ref >60)
Glucose, Bld: 89 mg/dL (ref 65–99)
Potassium: 3.5 mmol/L (ref 3.5–5.1)
Sodium: 140 mmol/L (ref 135–145)

## 2016-05-16 LAB — TROPONIN I: Troponin I: <0.03 ng/mL (ref <0.03)

## 2016-05-16 LAB — CBC
HCT: 36.2 % (ref 35.0–47.0)
Hemoglobin: 12.1 g/dL (ref 12.0–16.0)
MCH: 26.9 pg (ref 26.0–34.0)
MCHC: 33.6 g/dL (ref 32.0–36.0)
MCV: 80 fL (ref 80.0–100.0)
Platelets: 281 10*3/uL (ref 150–440)
RBC: 4.52 MIL/uL (ref 3.80–5.20)
RDW: 15.2 % — ABNORMAL HIGH (ref 11.5–14.5)
WBC: 5.7 10*3/uL (ref 3.6–11.0)

## 2016-05-16 LAB — POCT PREGNANCY, URINE: Preg Test, Ur: NEGATIVE

## 2016-05-16 MED ORDER — GADOBENATE DIMEGLUMINE 529 MG/ML IV SOLN
15.0000 mL | Freq: Once | INTRAVENOUS | Status: AC | PRN
  Administered 2016-05-16: 15 mL via INTRAVENOUS

## 2016-05-16 MED ORDER — ASPIRIN EC 81 MG PO TBEC: 81.0000 mg | DELAYED_RELEASE_TABLET | Freq: Every day | ORAL | 0 refills | Status: AC

## 2016-05-16 NOTE — ED Notes (Signed)
1 unsuccessful PIV attempt by this RN (left AC).

## 2016-05-16 NOTE — ED Triage Notes (Addendum)
Patient ambulatory to triage with steady gait, without difficulty; pt initially tearful, reports awakening at 540am with tightness to upper chest "like a band across my chest", tightness to left side of head and face, numbness to left arm; st has been occurring for "awhile" intermittently, st some nausea and diff swallowing; denies "pain" however; reports episode approx yr ago and was dx with stomach issues but didn't have the tightness to face; also reports some hx of anemia; when asked about recent labwork, st that she did have some done this year "at the [facility] clinic"; pt A&Ox3, MAEW, speech clear; pt taken to room 2 to be placed on card monitor for further evaluation; MD notified

## 2016-05-16 NOTE — ED Notes (Signed)
MD at bedside to update pt on results and discuss discharge.

## 2016-05-16 NOTE — ED Provider Notes (Signed)
[facility] Emergency Department Provider Note  ____________________________________________  Time seen: Approximately 7:26 AM  I have reviewed the triage vital signs and the nursing notes.   HISTORY  Chief Complaint Chest Pain    HPI Michelle Dudley is a 41 y.o. female him otherwise healthy, presenting for chest pressure, left face, arm and leg numbness, gait instability, and headaches. The patient reports that over the past week she has had an intermittent mild headache associated with numbness in the left eyebrow as well as left perioral region that comes and goes. This morning she woke up at 5:30 AM and noticed numbness and tingling in the left upper extremity. She believes the bathroom and noted a pressure sensation and a band across the entirety of the chest. This was associated with shortness of breath. When she ambulated, she felt "wobbly" due to left leg weakness. Over the past week, she has treated her headache with over-the-counter medications without significant improvement. She has also tried vitamins without any improvement.  SH: Negative tobacco or cocaine  FH: No family history of autoimmune disorders.   Past Medical History:  Diagnosis Date  . Allergy-induced asthma     Patient Active Problem List   Diagnosis Date Noted  . Cervicalgia 08/30/2015  . Headache 08/30/2015  . Allergy-induced asthma 01/06/2015    Past Surgical History:  Procedure Laterality Date  . WISDOM TOOTH EXTRACTION      Current Outpatient Rx  . Order #: [id number] Class: Historical Med  . Order #: [id number] Class: Historical Med  . Order #: [id number] Class: Historical Med  . Order #: [id number] Class: Historical Med  . Order #: [id number] Class: Normal  . Order #: [id number] Class: Print  . Order #: [id number] Class: Historical Med    Allergies Other and Permethrin  Family History  Problem Relation Age of Onset  . Hypertension Mother   . Hypertension Father   .  Kidney disease Father   . Diabetes Father   . Uterine cancer Maternal Aunt   . Cancer Maternal Aunt     uterine  . Diabetes Maternal Grandmother   . Diabetes Brother   . Hypertension Brother     Social History Social History  Substance Use Topics  . Smoking status: Never Smoker  . Smokeless tobacco: Never Used  . Alcohol use 0.0 oz/week     Comment: 1 drink per month    Review of Systems Constitutional: No fever/chills.No lightheadedness or syncope. Eyes: No visual changes. No blurred or double vision. ENT:  No congestion or rhinorrhea. Cardiovascular: Positive chest pressure. Denies palpitations. Respiratory: Positive shortness of breath.  No cough. Gastrointestinal: No abdominal pain.  No nausea, no vomiting.  No diarrhea.  No constipation. Genitourinary: Negative for dysuria. Musculoskeletal: Negative for back pain. Skin: Negative for rash. Neurological: Positive for intermittent headaches. Positive for numbness over the left eyebrow, left perioral region, and today, the left face, arm, and leg. Weakness of the left leg with associated gait instability.   10-point ROS otherwise negative.  ____________________________________________   PHYSICAL EXAM:  VITAL SIGNS: ED Triage Vitals  Enc Vitals Group     BP 05/16/16 0626 (!) 137/93     Pulse Rate 05/16/16 0626 91     Resp 05/16/16 0626 18     Temp 05/16/16 0626 98 F (36.7 C)     Temp Source 05/16/16 0626 Oral     SpO2 05/16/16 0626 100 %     Weight 05/16/16 0625 161 lb (73 kg)     Height 05/16/16  1610 5\' 4"  (1.626 m)     Head Circumference --      Peak Flow --      Pain Score 05/16/16 0641 2     Pain Loc --      Pain Edu? --      Excl. in [place]? --     Constitutional: Alert and oriented. Well appearing and in no acute distress. Answers questions appropriately. Eyes: Conjunctivae are normal.  EOMI. PERRLA. No horizontal or vertical nystagmus. No scleral icterus. Head: Atraumatic. Nose: No  congestion/rhinnorhea. Mouth/Throat: Mucous membranes are moist.  Neck: No stridor.  Supple.  No JVD. No meningismus. Cardiovascular: Normal rate, regular rhythm. No murmurs, rubs or gallops.  Respiratory: Normal respiratory effort.  No accessory muscle use or retractions. Lungs CTAB.  No wheezes, rales or ronchi. Gastrointestinal: Soft, nontender and nondistended.  No guarding or rebound.  No peritoneal signs. Musculoskeletal: No LE edema. No ttp in the calves or palpable cords.  Negative Homan's sign. Neurologic: Alert and oriented 3. Speech is clear.  Face and smile symmetric. Tongue is midline. EOMI. PERRLA. No vertical or horizontal nystagmus.  No pronator drift. 5 out of 5 grip, biceps, triceps, right hip flexor, plantar flexion and dorsiflexion. 4++/5 left hip flexor. Decreased sensation to light touch in the left upper and lower extremities, and face. Normal heel-to-shin. Skin:  Skin is warm, dry and intact. No rash noted. Psychiatric: Mood and affect are normal. Speech and behavior are normal.  Normal judgement.  ____________________________________________   LABS (all labs ordered are listed, but only abnormal results are displayed)  Labs Reviewed  CBC - Abnormal; Notable for the following:       Result Value   RDW 15.2 (*)    All other components within normal limits  BASIC METABOLIC PANEL - Abnormal; Notable for the following:    Calcium 8.7 (*)    All other components within normal limits  TROPONIN I  POC URINE PREG, ED  POCT PREGNANCY, URINE   ____________________________________________  EKG  ED ECG REPORT I, Eula Listen, the attending physician, personally viewed and interpreted this ECG.   Date: 05/16/2016  EKG Time: 634  Rate: 91  Rhythm: normal sinus rhythm  Axis: normal  Intervals:none  ST&T Change: Nonspecific T-wave inversions in V1. No ST elevation.  ____________________________________________  RADIOLOGY  Ct Head Wo Contrast  Result  Date: 05/16/2016 CLINICAL DATA:  Left upper extremity numbness EXAM: CT HEAD WITHOUT CONTRAST TECHNIQUE: Contiguous axial images were obtained from the base of the skull through the vertex without intravenous contrast. COMPARISON:  Brain MRI September 11, 2015 FINDINGS: Brain: The ventricles are normal in size and configuration. There is no intracranial mass, hemorrhage, extra-axial fluid collection, or midline shift. Gray-white compartments appear normal. No acute infarct evident. Vascular: No hyperdense vessel. There is no appreciable arteriovascular calcification. Skull: Bony calvarium appears intact. Sinuses/Orbits: Visualized paranasal sinuses are clear. Visualized orbits appear symmetric bilaterally. Other: Visualized mastoid air cells are clear. IMPRESSION: Study within normal limits. Electronically Signed   By: Lowella Grip III M.D.   On: 05/16/2016 07:34   Mr Brain W And Wo Contrast  Result Date: 05/16/2016 CLINICAL DATA:  Left-sided numbness.  Headache EXAM: MRI HEAD WITHOUT AND WITH CONTRAST TECHNIQUE: Multiplanar, multiecho pulse sequences of the brain and surrounding structures were obtained without and with intravenous contrast. CONTRAST:  23mL MULTIHANCE GADOBENATE DIMEGLUMINE 529 MG/ML IV SOLN COMPARISON:  CT head 05/16/2016 FINDINGS: Brain: No acute infarction, hemorrhage, hydrocephalus, extra-axial collection or mass lesion. Vascular:  Normal arterial flow voids.  Normal venous enhancement. Skull and upper cervical spine: Negative Sinuses/Orbits: Negative Other: None IMPRESSION: Negative MRI of the head with contrast. Electronically Signed   By: Franchot Gallo M.D.   On: 05/16/2016 10:32   Dg Chest Portable 1 View  Result Date: 05/16/2016 CLINICAL DATA:  Left arm numbness, headache, history of asthma. EXAM: PORTABLE CHEST 1 VIEW COMPARISON:  Chest x-ray of May 04, 2015 FINDINGS: The lungs are well-expanded and clear. The heart and pulmonary vascularity are normal. The mediastinum is normal  in width. There is no pleural effusion. The bony thorax is unremarkable. IMPRESSION: There is no active cardiopulmonary disease. Electronically Signed   By: David  Martinique M.D.   On: 05/16/2016 07:13    ____________________________________________   PROCEDURES  Procedure(s) performed: None  Procedures  Critical Care performed: No ____________________________________________   INITIAL IMPRESSION / ASSESSMENT AND PLAN / ED COURSE  Pertinent labs & imaging results that were available during my care of the patient were reviewed by me and considered in my medical decision making (see chart for details).  41 y.o. female presenting with 1 week of intermittent headaches and focal facial numbness, today with chest pressure, and left-sided sensory deficit. Overall, the patient's vital signs are reassuring. Neurologically, she does have some deficit in her sensory exam and her motor exam of the left hip flexor. Given her age and low number of risk factors, I am concerned about multiple etiologies including atypical migraine, multiple sclerosis, and we will evaluate for acute CVA although this is less likely. ACS or MI is very unlikely given her age and lack of risk factors, as well as her reassuring EKG, but we'll also run a troponin. We will initiate basic laboratory studies, rule out pregnancy, and CT scan; if this is negative, will proceed with MR. Plan reevaluation for final disposition.  ----------------------------------------- 9:22 AM on 05/16/2016 -----------------------------------------  Reviewing the patient's chart, she has had neurologic evaluation for headaches in the past, including MRI with and without contrast in August 2017, which showed a normal brain. Will proceed with imaging, given the patient's new symptoms.  ED Course: The patient had an MRI which did not show any abnormalities. I spoke with Dr. Doy Mince, the neurologist on-call, who agreed that the patient has undergone a  complete workup. Her recommendation was to start the patient on a daily low-dose aspirin, and have her follow-up with her neurologist in [place]. The patient's chest pain workup was also reassuring, with negative troponin, and EKG without ischemic changes, and chest x-ray without any acute cardiopulmonary findings. The patient is discharged in stable condition, and return precautions and follow-up instructions were discussed by me. ____________________________________________  FINAL CLINICAL IMPRESSION(S) / ED DIAGNOSES  Final diagnoses:  Acute nonintractable headache, unspecified headache type  Left sided numbness         NEW MEDICATIONS STARTED DURING THIS VISIT:  Discharge Medication List as of 05/16/2016 11:45 AM    START taking these medications   Details  aspirin EC 81 MG tablet Take 1 tablet (81 mg total) by mouth daily., Starting Wed 05/16/2016, Until Thu 05/16/2017, Print          Eula Listen, MD 05/16/16 1455

## 2016-05-16 NOTE — ED Notes (Signed)
Pt resting in bed, husband at bedside,  Made aware of pending MRI

## 2016-05-16 NOTE — ED Notes (Signed)
EDP at bedside  

## 2016-05-16 NOTE — Discharge Instructions (Signed)
Please drink plenty of fluid to stay well hydrated, eat small regular healthy meals throughout the day, and get plenty of exercise.  You may take Tylenol or Motrin for your headache.  Return to the emergency department if you develop severe pain, shortness of breath, fever, lightheadedness or fainting, new numbness tingling or weakness, difficulty walking, changes in vision or speech, confusion, or any other symptoms concerning to you.

## 2016-05-16 NOTE — ED Notes (Signed)
Pt transported to MRI 

## 2018-02-19 IMAGING — MR MR HEAD WO/W CM
12 series · 48 of 48 positions shown · IV contrast (15mL MULTIHANCE)
Comparison: CT head 05/16/2016

CLINICAL DATA: Left-sided numbness.  Headache

EXAM:
MRI HEAD WITHOUT AND WITH CONTRAST
TECHNIQUE: Multiplanar, multiecho pulse sequences of the brain and surrounding
structures were obtained without and with intravenous contrast.
CONTRAST:  15mL MULTIHANCE GADOBENATE DIMEGLUMINE 529 MG/ML IV SOLN

[Series 2: T1 · sagittal · 5.0mm · 0.45mm/px · 1 of 25 slices shown (1 of 2)]
[im 1/25]
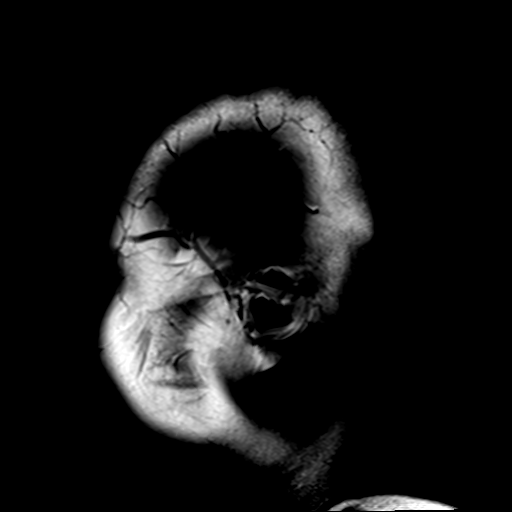

[Series 3: T2 · axial · 5.0mm · 0.60mm/px · z∈[-36,+118]mm · 2 of 25 slices shown (1 of 2)]
[im 1/25]
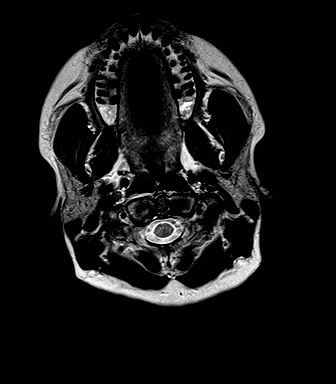
[im 25/25]
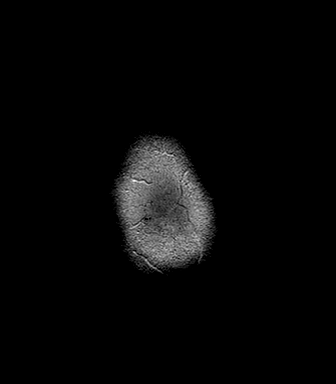

[Series 5: DWI · axial · 3.0mm · 1.80mm/px · z∈[-39,+121]mm · 4 of 54 slices shown (1 of 2)]
[im 1/54]
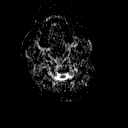
[im 18/54]
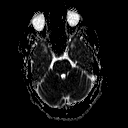
[im 36/54]
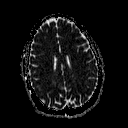
[im 54/54]
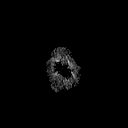

[Series 7: DWI · coronal · 3.0mm · 1.80mm/px · 3 of 45 slices shown (2 of 2)]
[im 1/45]
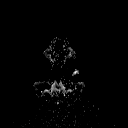
[im 23/45]
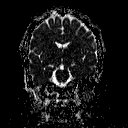
[im 45/45]
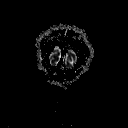

[Series 8: FLAIR · axial · 3.0mm · 0.45mm/px · z∈[-36,+118]mm · 3 of 53 slices shown]
[im 1/53]
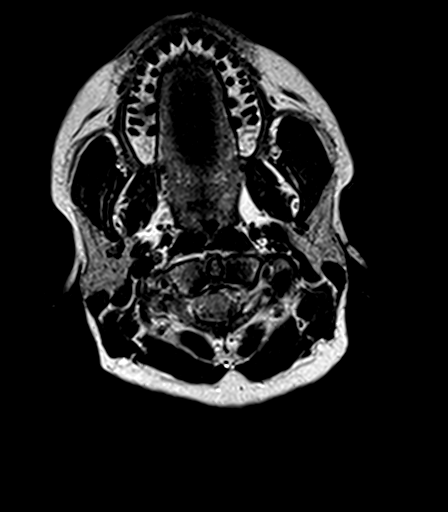
[im 27/53]
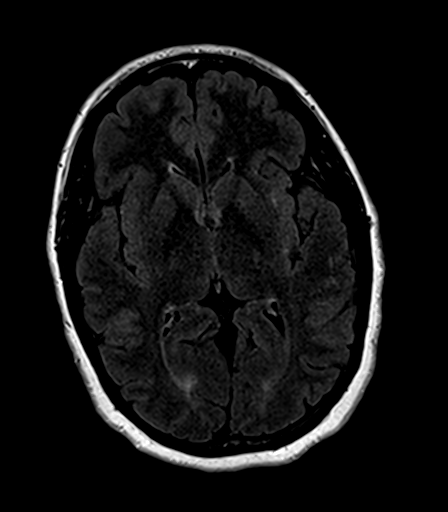
[im 53/53]
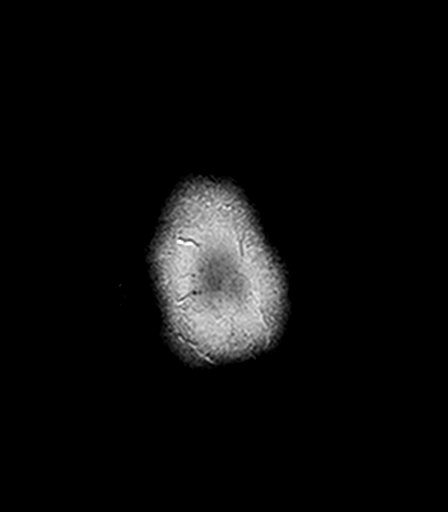

[Series 9: T2 · axial · 5.0mm · 0.45mm/px · z∈[-36,+118]mm · 2 of 25 slices shown (2 of 2)]
[im 1/25]
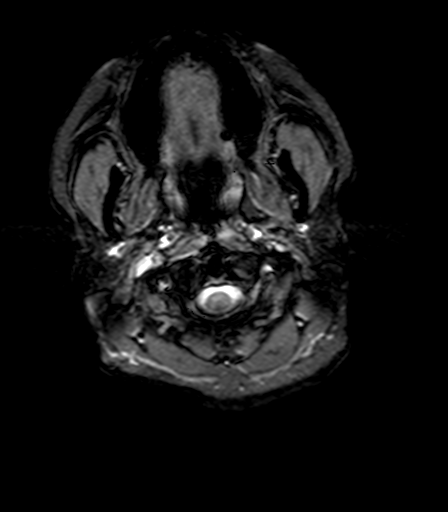
[im 25/25]
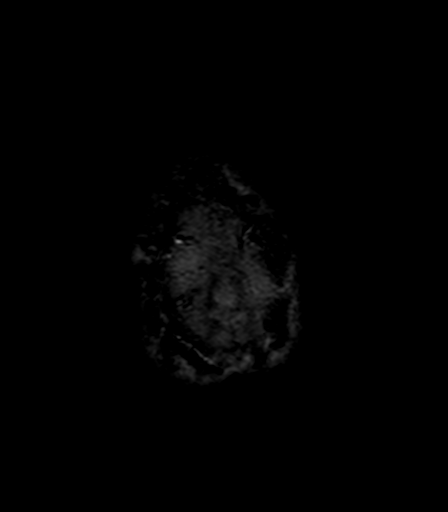

[Series 10: T1 · axial · 1.0mm · 1.00mm/px · z∈[-48,+126]mm · 11 of 176 slices shown (2 of 2)]
[im 1/176]
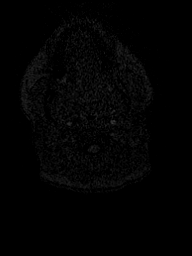
[im 18/176]
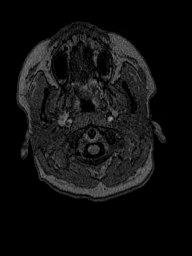
[im 36/176]
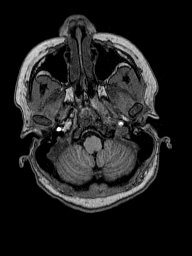
[im 53/176]
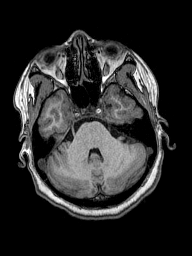
[im 71/176]
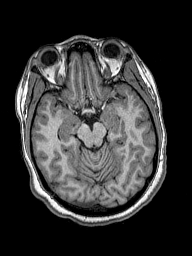
[im 88/176]
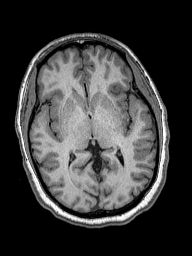
[im 106/176]
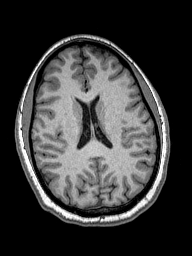
[im 123/176]
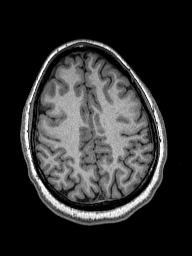
[im 141/176]
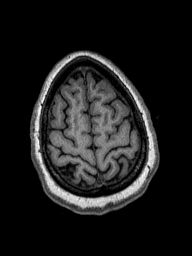
[im 158/176]
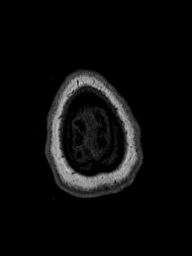
[im 176/176]
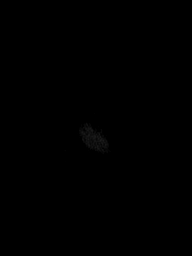

[Series 11: T2 post-contrast · coronal · 5.0mm · 0.49mm/px · 2 of 29 slices shown]
[im 1/29]
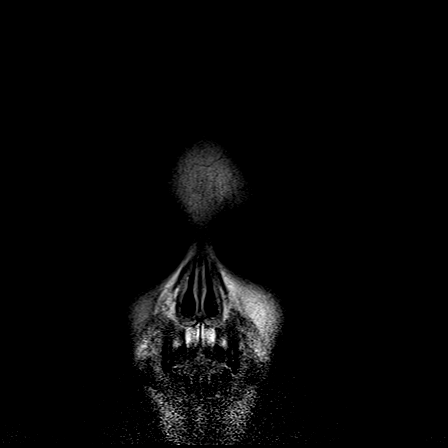
[im 29/29]
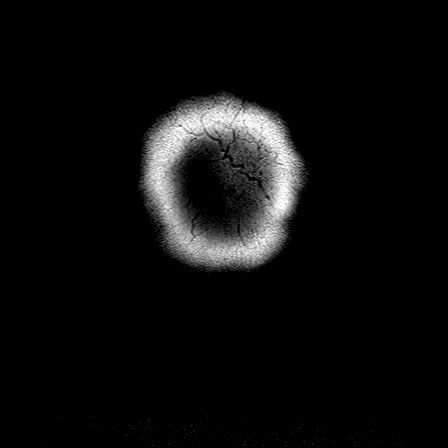

[Series 12: T1 post-contrast · axial · 1.0mm · 1.00mm/px · z∈[-48,+126]mm · 11 of 176 slices shown (1 of 2)]
[im 1/176]
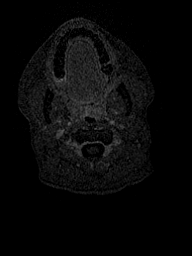
[im 18/176]
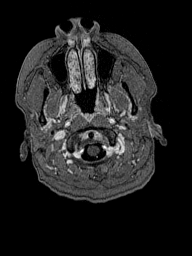
[im 36/176]
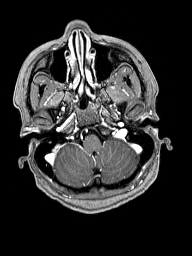
[im 53/176]
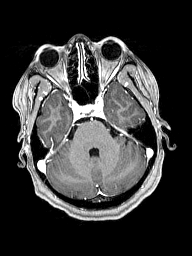
[im 71/176]
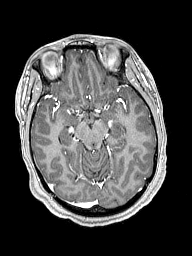
[im 88/176]
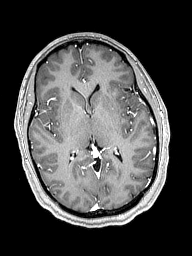
[im 106/176]
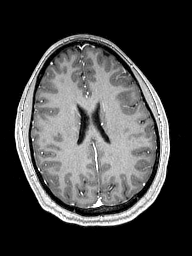
[im 123/176]
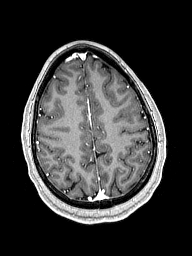
[im 141/176]
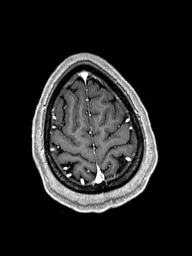
[im 158/176]
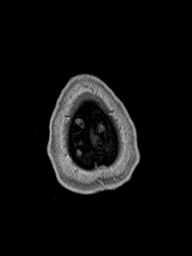
[im 176/176]
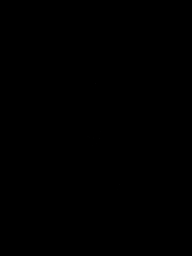

[Series 13: T1 post-contrast · coronal · 5.0mm · 0.43mm/px · 2 of 29 slices shown (2 of 2)]
[im 1/29]
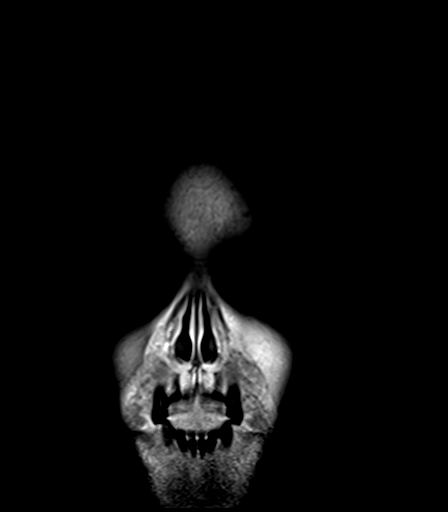
[im 29/29]
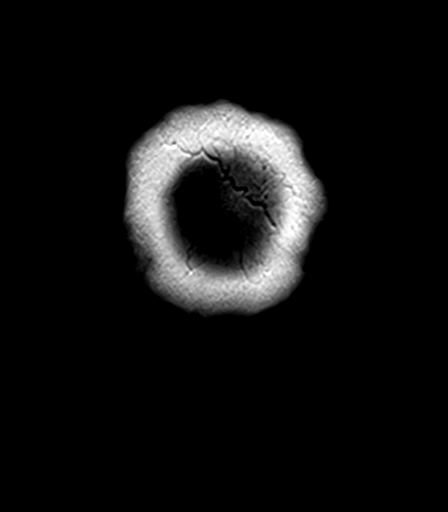

[Series 100: ax (id) · axial · 3.0mm · 1.80mm/px · z∈[-39,+121]mm · 4 of 55 slices shown]
[im 1/55]
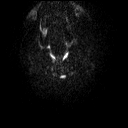
[im 19/55]
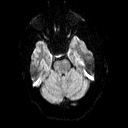
[im 37/55]
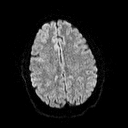
[im 55/55]
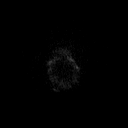

[Series 101: cor (id) · coronal · 3.0mm · 1.80mm/px · 3 of 45 slices shown]
[im 1/45]
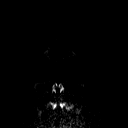
[im 23/45]
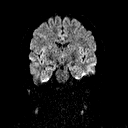
[im 45/45]
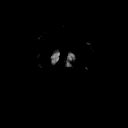

[48 of 48 positions shown; findings below may reference images not displayed]

FINDINGS: Brain: No acute infarction, hemorrhage, hydrocephalus, extra-axial
collection or mass lesion.

Vascular: Normal arterial flow voids.  Normal venous enhancement.

Skull and upper cervical spine: Negative

Sinuses/Orbits: Negative

Other: None
IMPRESSION: Negative MRI of the head with contrast.

## 2018-06-20 ENCOUNTER — Telehealth: Payer: Self-pay

## 2018-06-20 NOTE — Telephone Encounter (Signed)
Pt calling to connect info to [facility] and needs to know blood type.  [phone]  Pt hasn't been seen by [place] since we went to [employer].  Pt wanted to know if information in the previous portal we used would be in MyChart.  Adv it would not. She was asking how to get that information.  Adv to go to our website, fill out ROI form and fax it to [place].  Pt aware blood type is B+.

## 2018-06-21 ENCOUNTER — Other Ambulatory Visit: Payer: Self-pay | Admitting: *Deleted

## 2018-06-21 DIAGNOSIS — Z20822 Contact with and (suspected) exposure to covid-19: Secondary | ICD-10-CM

## 2018-06-23 LAB — NOVEL CORONAVIRUS, NAA: SARS-CoV-2, NAA: NOT DETECTED

## 2018-07-26 ENCOUNTER — Other Ambulatory Visit: Payer: Self-pay

## 2018-07-26 DIAGNOSIS — Z20822 Contact with and (suspected) exposure to covid-19: Secondary | ICD-10-CM

## 2018-08-01 LAB — NOVEL CORONAVIRUS, NAA: SARS-CoV-2, NAA: NOT DETECTED

## 2018-09-06 ENCOUNTER — Other Ambulatory Visit: Payer: Self-pay

## 2018-09-06 DIAGNOSIS — Z20822 Contact with and (suspected) exposure to covid-19: Secondary | ICD-10-CM

## 2018-09-07 LAB — NOVEL CORONAVIRUS, NAA: SARS-CoV-2, NAA: NOT DETECTED

## 2018-10-06 ENCOUNTER — Ambulatory Visit (INDEPENDENT_AMBULATORY_CARE_PROVIDER_SITE_OTHER): Payer: Managed Care, Other (non HMO) | Admitting: Internal Medicine

## 2018-10-06 ENCOUNTER — Encounter: Payer: Self-pay | Admitting: Internal Medicine

## 2018-10-06 ENCOUNTER — Other Ambulatory Visit: Payer: Self-pay

## 2018-10-06 VITALS — BP 110/72 | HR 84 | Ht 64.0 in | Wt 180.0 lb

## 2018-10-06 DIAGNOSIS — E063 Autoimmune thyroiditis: Secondary | ICD-10-CM | POA: Diagnosis not present

## 2018-10-06 DIAGNOSIS — E038 Other specified hypothyroidism: Secondary | ICD-10-CM

## 2018-10-06 NOTE — Progress Notes (Signed)
Patient ID: YAILEEN NACHBAR, female   DOB: Dec 28, 1975, 43 y.o.   MRN: [id number]    HPI  DOMONIQUE NAKAHARA is a 43 y.o.-year-old female, referred by Dr. Lysle Rubens, for management of hypothyroidism due to Hashimoto's thyroiditis.  Pt. has been dx with hypothyroidism in 05/2018 >> PCP recommended to start on Levothyroxine 25 mcg >> did not take it.   She saw her chiropractor and also an integrative medicine provider and started a higher vit D dose, selenium, Thyrotrophin PMG 1 tab (109 mg).  Her TFTs improved in 07/2018.  However, she stopped the thyroid extract ~1 mo ago.  She continues on selenium.  When she was taking thyrotrophin PMG, she was taking this: - with food! - with multivitamins! - with water - separated by >30 min from b'fast  - no calcium, iron, PPIs  I reviewed pt's thyroid tests: 08/04/2018: TSH 3.55 06/18/2018: TSH 6.78 (0.45-4.5), fT3 2.6 (2-4.4), TT4 (4.5-12) 06/02/2018: TSH 6.78 02/27/2018: TSH 4.36 (0.34-4.5) Lab Results  Component Value Date   TSH 2.31 11/15/2015   TSH 2.63 06/06/2015   TSH 3.18 01/28/2015   Antithyroid antibodies: 06/22/2018: TPO Ab 284 (0-34) ATA Ab 18.4 (0-0.9) No results found for: THGAB No components found for: TPOAB  Pt describes: - no weight gain - + fatigue - + poor sleep - no heat or cold intolerance - no depression - no constipation - + dry skin/itching-improved - no hair loss  Pt denies feeling nodules in neck, hoarseness, dysphagia/odynophagia, SOB with lying down.  She has + FH of thyroid disorders in: father with hyperthyroidism. No FH of thyroid cancer.  No h/o radiation tx to head or neck. No recent use of iodine supplements.  Pt. also has a history of anemia - fibroids, but thalassemia minor.  ROS: Constitutional: + See HPI Eyes: no blurry vision, no xerophthalmia ENT: no sore throat, + see HPI Cardiovascular: no CP/SOB/palpitations/+ improved leg swelling Respiratory: no cough/SOB Gastrointestinal: no N/V/D/C/+  acid reflux Musculoskeletal: + Occasional muscle/joint aches Skin: no rashes, + itching-improved, + easy bruising Neurological: no tremors/numbness/tingling/dizziness Psychiatric: no depression/anxiety  Past Medical History:  Diagnosis Date  . Allergy-induced asthma    Past Surgical History:  Procedure Laterality Date  . WISDOM TOOTH EXTRACTION     Social History   Socioeconomic History  . Marital status: Married    Spouse name: Gerald Stabs   . Number of children: 0  . Years of education: PhD  . Highest education level: Not on file  Occupational History  . Occupation: [employer]  Social Needs  . Financial resource strain: Not on file  . Food insecurity    Worry: Not on file    Inability: Not on file  . Transportation needs    Medical: Not on file    Non-medical: Not on file  Tobacco Use  . Smoking status: Never Smoker  . Smokeless tobacco: Never Used  Substance and Sexual Activity  . Alcohol use: Yes    Alcohol/week: 0.0 standard drinks    Comment: 1 drink per month  . Drug use: No  . Sexual activity: Yes    Partners: Male    Birth control/protection: None    Comment: One partner for the past ten years  Lifestyle  . Physical activity    Days per week: Not on file    Minutes per session: Not on file  . Stress: Not on file  Relationships  . Social connections    Talks on phone: Not on file  Gets together: Not on file    Attends religious service: Not on file    Active member of club or organization: Not on file    Attends meetings of clubs or organizations: Not on file    Relationship status: Not on file  . Intimate partner violence    Fear of current or ex partner: Not on file    Emotionally abused: Not on file    Physically abused: Not on file    Forced sexual activity: Not on file  Other Topics Concern  . Not on file  Social History Narrative   Lives with husband   Caffeine use: Very rarely- tea    Current Outpatient Medications on File  Prior to Visit  Medication Sig Dispense Refill  . albuterol (PROVENTIL HFA;VENTOLIN HFA) 108 (90 Base) MCG/ACT inhaler Inhale 1-2 puffs into the lungs every 6 (six) hours as needed for wheezing or shortness of breath. 1 Inhaler 1  . cetirizine (ZYRTEC) 10 MG tablet Take 10 mg by mouth daily as needed.     . cholecalciferol (VITAMIN D) 1000 UNITS tablet Take 1,000 Units by mouth every other day.    Marland Kitchen co-enzyme Q-10 30 MG capsule Take 30 mg by mouth every other day.     . IRON PO Take 27 mg by mouth every other day.    . Multiple Vitamin (MULTIVITAMIN) tablet Take 1 tablet by mouth daily.     No current facility-administered medications on file prior to visit.    Allergies  Allergen Reactions  . Other     Cats  . Permethrin    Family History  Problem Relation Age of Onset  . Hypertension Mother   . Hypertension Father   . Kidney disease Father   . Diabetes Father   . Uterine cancer Maternal Aunt   . Cancer Maternal Aunt        uterine  . Diabetes Maternal Grandmother   . Diabetes Brother   . Hypertension Brother     PE: BP 110/72   Pulse 84   Ht 5\' 4"  (1.626 m)   Wt 180 lb (81.6 kg)   SpO2 98%   BMI 30.90 kg/m  Wt Readings from Last 3 Encounters:  10/06/18 180 lb (81.6 kg)  05/16/16 161 lb (73 kg)  12/19/15 160 lb (72.6 kg)   Constitutional: overweight, in NAD Eyes: PERRLA, EOMI, no exophthalmos ENT: moist mucous membranes, no thyromegaly, no cervical lymphadenopathy Cardiovascular: RRR, No MRG Respiratory: CTA B Gastrointestinal: abdomen soft, NT, ND, BS+ Musculoskeletal: no deformities, strength intact in all 4 Skin: moist, warm, no rashes Neurological: no tremor with outstretched hands, DTR normal in all 4  ASSESSMENT: 1. Hypothyroidism due to Hashimoto's thyroiditis  PLAN:  1. Patient with recently diagnosed subclinical hypothyroidism with positive antithyroid antibodies, suggestive of Hashimoto's hypothyroidism.  Her TSH level returned 6.7 3 months ago,  after which she was suggested to start a low-dose levothyroxine.  She is that saw her naturopath, who recommended increasing the dose of vitamin D, adding selenium, and also starting her on desiccated thyroid extract.  She also started to work on improving her diet.  She is not eating dairy other than daughter and she reduced gluten.  Her TFTs normalized a month later.  Approximately 1 month ago she stopped her thyrotrophin PMG and she is wondering whether she still needs to take this. - she appears euthyroid; she does not appear to have a goiter, thyroid nodules, or neck compression symptoms - At this visit,  we discussed about Hashimoto's thyroiditis.the fact that this is an autoimmune disease which can affect her thyroid over time and cause hypothyroidism.  We discussed about ways to improve her immune system to reduce thyroid damage: Improve diet- use an alkaline, nonanimal, anti-inflammatory diet, stop dairy, reduce gluten; exercise; rest; reduce stress. Subclinical hypothyroidism is a mild condition, which does not necessarily need to be treated with thyroid hormones. Indications for treatment are:  Desire for pregnancy  TSH higher than 10  TSH lower than 10 with signs or symptoms consistent with hypothyroidism - pt has a history of infertility and failed IUI's.  She and her husband are considering IVF but they are not decided for this yet.  She would still like to get pregnant naturally, if possible. In this case, I recommended treatment to get her TSH at the target of 2.5 or less.  I would absolutely not recommend desiccated thyroid extracts. I explained why treatment with desiccated thyroid extracts is not indicated in her case since she was trying to get pregnant.  She understands and agrees with the plan.  If the results show a TSH higher than 2.5, will start levothyroxine. - We discussed about correct intake of levothyroxine, fasting, with water, separated by at least 30 minutes from breakfast,  and separated by more than 4 hours from calcium, iron, multivitamins, acid reflux medications (PPIs). - will check thyroid tests today: TSH, free T4, free T3, TPO and ATA antibodies.  We will continue selenium for now but she will probably need to discontinue this when she gets pregnant.  Also, we can stop it if the antibody titers are approximately the same as before - If labs today are abnormal, she will need to return in ~5-6 weeks for repeat labs - Otherwise, I will see her back in 3-4 months  Component     Latest Ref Rng & Units 10/06/2018  TSH     0.35 - 4.50 uIU/mL 6.52 (H)  T4,Free(Direct)     0.60 - 1.60 ng/dL 0.67  Triiodothyronine,Free,Serum     2.3 - 4.2 pg/mL 2.4  Thyroperoxidase Ab SerPl-aCnc     <9 IU/mL 540 (H)  Thyroglobulin Ab     < or = 1 IU/mL 12 (H)   TSH is elevated, as are her TPO and thyroglobulin antibodies.  ATA antibodies appear slightly better, but TPO antibodies are higher.  At this point, I would suggest to stop selenium and start levothyroxine 50 mcg daily.  I will have her back for recheck in 5 to 6 weeks.  Philemon Kingdom, MD PhD [facility] Endocrinology

## 2018-10-06 NOTE — Patient Instructions (Addendum)
Please stop at the lab.  If we end up starting Levothyroxine, take the thyroid hormone every day, with water, at least 30 minutes before breakfast, separated by at least 4 hours from: - acid reflux medications - calcium - iron - multivitamins  Please come back for a follow-up appointment in 3-4 months.

## 2018-10-07 DIAGNOSIS — E038 Other specified hypothyroidism: Secondary | ICD-10-CM | POA: Insufficient documentation

## 2018-10-07 DIAGNOSIS — E063 Autoimmune thyroiditis: Secondary | ICD-10-CM | POA: Insufficient documentation

## 2018-10-07 LAB — THYROID PEROXIDASE ANTIBODY: Thyroperoxidase Ab SerPl-aCnc: 540 IU/mL — ABNORMAL HIGH (ref <9)

## 2018-10-07 LAB — T3, FREE: T3, Free: 2.4 pg/mL (ref 2.3–4.2)

## 2018-10-07 LAB — TSH: TSH: 6.52 u[IU]/mL — ABNORMAL HIGH (ref 0.35–4.50)

## 2018-10-07 LAB — THYROGLOBULIN ANTIBODY: Thyroglobulin Ab: 12 IU/mL — ABNORMAL HIGH (ref <=1)

## 2018-10-07 LAB — T4, FREE: Free T4: 0.67 ng/dL (ref 0.60–1.60)

## 2018-10-07 MED ORDER — LEVOTHYROXINE SODIUM 50 MCG PO TABS: 50.0000 ug | ORAL_TABLET | Freq: Every day | ORAL | 3 refills | Status: DC

## 2018-10-08 ENCOUNTER — Encounter: Payer: Self-pay | Admitting: Internal Medicine

## 2018-10-08 NOTE — Telephone Encounter (Signed)
Please review concerns and advise

## 2018-11-29 ENCOUNTER — Encounter: Payer: Self-pay | Admitting: Internal Medicine

## 2018-12-01 ENCOUNTER — Other Ambulatory Visit: Payer: Self-pay | Admitting: Internal Medicine

## 2018-12-01 DIAGNOSIS — E038 Other specified hypothyroidism: Secondary | ICD-10-CM

## 2018-12-01 DIAGNOSIS — E063 Autoimmune thyroiditis: Secondary | ICD-10-CM

## 2018-12-09 ENCOUNTER — Other Ambulatory Visit: Payer: Self-pay | Admitting: Cardiology

## 2018-12-09 DIAGNOSIS — Z20822 Contact with and (suspected) exposure to covid-19: Secondary | ICD-10-CM

## 2018-12-11 LAB — NOVEL CORONAVIRUS, NAA: SARS-CoV-2, NAA: NOT DETECTED

## 2018-12-24 ENCOUNTER — Other Ambulatory Visit: Payer: Self-pay

## 2018-12-24 ENCOUNTER — Other Ambulatory Visit (INDEPENDENT_AMBULATORY_CARE_PROVIDER_SITE_OTHER): Payer: Managed Care, Other (non HMO)

## 2018-12-24 DIAGNOSIS — E063 Autoimmune thyroiditis: Secondary | ICD-10-CM

## 2018-12-24 DIAGNOSIS — E038 Other specified hypothyroidism: Secondary | ICD-10-CM

## 2018-12-25 LAB — T4, FREE: Free T4: 0.61 ng/dL (ref 0.60–1.60)

## 2018-12-25 LAB — VITAMIN D 25 HYDROXY (VIT D DEFICIENCY, FRACTURES): VITD: 30.9 ng/mL (ref 30.00–100.00)

## 2018-12-25 LAB — TSH: TSH: 2.5 u[IU]/mL (ref 0.35–4.50)

## 2019-01-05 ENCOUNTER — Encounter: Payer: Self-pay | Admitting: Internal Medicine

## 2019-01-05 ENCOUNTER — Other Ambulatory Visit: Payer: Self-pay

## 2019-01-05 ENCOUNTER — Ambulatory Visit (INDEPENDENT_AMBULATORY_CARE_PROVIDER_SITE_OTHER): Payer: Managed Care, Other (non HMO) | Admitting: Internal Medicine

## 2019-01-05 VITALS — BP 126/70 | HR 87 | Ht 64.0 in | Wt 182.0 lb

## 2019-01-05 DIAGNOSIS — E038 Other specified hypothyroidism: Secondary | ICD-10-CM

## 2019-01-05 DIAGNOSIS — E063 Autoimmune thyroiditis: Secondary | ICD-10-CM | POA: Diagnosis not present

## 2019-01-05 MED ORDER — LEVOTHYROXINE SODIUM 50 MCG PO TABS: 25.0000 ug | ORAL_TABLET | Freq: Every day | ORAL | 3 refills | Status: DC

## 2019-01-05 NOTE — Patient Instructions (Addendum)
Please continue levothyroxine 25 mcg daily.  Take the thyroid hormone every day, with water, at least 30 minutes before breakfast, separated by at least 4 hours from: - acid reflux medications - calcium - iron - multivitamins.  Please come back for a follow-up appointment in 6 months.

## 2019-01-05 NOTE — Progress Notes (Signed)
Patient ID: Michelle Dudley, female   DOB: 1975-04-10, 43 y.o.   MRN: [id number]   This visit occurred during the SARS-CoV-2 public health emergency.  Safety protocols were in place, including screening questions prior to the visit, additional usage of staff PPE, and extensive cleaning of exam room while observing appropriate contact time as indicated for disinfecting solutions.   HPI  Michelle Dudley is a 43 y.o.-year-old female, initially referred by Dr. Lysle Rubens, returning for follow-up for  hypothyroidism due to Hashimoto's thyroiditis.  Last visit 3 months ago.  Reviewed and addended history: Pt. has been dx with hypothyroidism in 05/2018 >> PCP recommended to start on Levothyroxine 25 mcg >> did not take it.   She saw her chiropractor and also an integrative medicine provider and started a higher vit D dose, selenium, Thyrotrophin PMG 1 tab (109 mg).  Her TFTs improved in 07/2018.  However, she stopped the thyroid extract approximately 1 month before last visit.    At last visit she was on selenium but we stopped after her TPO antibodies returned higher.    We started her on levothyroxine 09/2018.  Pt is still on levothyroxine 25 mcg daily (she did not increase the dose to 50 mcg daily as advised), taken: - in am - fasting - at least 30 min from b'fast - no Ca, Fe, MVI, - rarely PPIs at night - not on Biotin  She describes dizziness after she takes levothyroxine as she is trying to delay her meal to more than 30 minutes after taking the thyroid hormone.  Reviewed her TFTs: Lab Results  Component Value Date   TSH 2.50 12/24/2018   TSH 6.52 (H) 10/06/2018   TSH 2.31 11/15/2015   TSH 2.63 06/06/2015   TSH 3.18 01/28/2015   FREET4 0.61 12/24/2018   FREET4 0.67 10/06/2018   T3FREE 2.4 10/06/2018  08/04/2018: TSH 3.55 06/18/2018: TSH 6.78 (0.45-4.5), fT3 2.6 (2-4.4), TT4 (4.5-12) 06/02/2018: TSH 6.78 02/27/2018: TSH 4.36 (0.34-4.5)  Thyroid antibodies were elevated: Component      Latest Ref Rng & Units 10/06/2018  Thyroperoxidase Ab SerPl-aCnc     <9 IU/mL 540 (H)  Thyroglobulin Ab     < or = 1 IU/mL 12 (H)   06/22/2018: TPO Ab 284 (0-34) ATA Ab 18.4 (0-0.9)  At last visit she described fatigue, poor sleep, dry skin.  She is feeling a little better after starting levothyroxine except for the dizziness.  Pt denies feeling nodules in neck, hoarseness, dysphagia/odynophagia, SOB with lying down.  She has + FH of thyroid disorders in: father with hyperthyroidism.No FH of thyroid cancer. No h/o radiation tx to head or neck.  No seaweed or kelp. No recent contrast studies. No herbal supplements. No Biotin use. No recent steroids use.   Pt. also has a history of anemia - fibroids, thalassemia minor.  She and her husband are still contemplating a pregnancy.  She has regular menstrual cycles.  ROS: Constitutional: no weight gain/no weight loss, no fatigue, no subjective hyperthermia, no subjective hypothermia Eyes: no blurry vision, no xerophthalmia ENT: no sore throat, + see HPI Cardiovascular: no CP/no SOB/no palpitations/no leg swelling Respiratory: no cough/no SOB/no wheezing Gastrointestinal: no N/no V/no D/no C/no acid reflux Musculoskeletal: no muscle aches/no joint aches Skin: no rashes, no hair loss Neurological: no tremors/no numbness/no tingling/+ dizziness after levothyroxine  I reviewed pt's medications, allergies, PMH, social hx, family hx, and changes were documented in the history of present illness. Otherwise, unchanged from my initial visit note.  Past Medical History:  Diagnosis Date  . Allergy-induced asthma    Past Surgical History:  Procedure Laterality Date  . WISDOM TOOTH EXTRACTION     Social History   Socioeconomic History  . Marital status: Married    Spouse name: Gerald Stabs   . Number of children: 0  . Years of education: PhD  . Highest education level: Not on file  Occupational History  . Occupation: [employer]   Tobacco Use  . Smoking status: Never Smoker  . Smokeless tobacco: Never Used  Substance and Sexual Activity  . Alcohol use: Yes    Alcohol/week: 0.0 standard drinks    Comment: 1 drink per month  . Drug use: No  . Sexual activity: Yes    Partners: Male    Birth control/protection: None    Comment: One partner for the past ten years  Other Topics Concern  . Not on file  Social History Narrative   Lives with husband   Caffeine use: Very rarely- tea    Social Determinants of Health   Financial Resource Strain:   . Difficulty of Paying Living Expenses: Not on file  Food Insecurity:   . Worried About Charity fundraiser in the Last Year: Not on file  . Ran Out of Food in the Last Year: Not on file  Transportation Needs:   . Lack of Transportation (Medical): Not on file  . Lack of Transportation (Non-Medical): Not on file  Physical Activity:   . Days of Exercise per Week: Not on file  . Minutes of Exercise per Session: Not on file  Stress:   . Feeling of Stress : Not on file  Social Connections:   . Frequency of Communication with Friends and Family: Not on file  . Frequency of Social Gatherings with Friends and Family: Not on file  . Attends Religious Services: Not on file  . Active Member of Clubs or Organizations: Not on file  . Attends Archivist Meetings: Not on file  . Marital Status: Not on file  Intimate Partner Violence:   . Fear of Current or Ex-Partner: Not on file  . Emotionally Abused: Not on file  . Physically Abused: Not on file  . Sexually Abused: Not on file   Current Outpatient Medications on File Prior to Visit  Medication Sig Dispense Refill  . albuterol (PROVENTIL HFA;VENTOLIN HFA) 108 (90 Base) MCG/ACT inhaler Inhale 1-2 puffs into the lungs every 6 (six) hours as needed for wheezing or shortness of breath. 1 Inhaler 1  . cholecalciferol (VITAMIN D) 1000 UNITS tablet Take 1,000 Units by mouth every other day.    . IRON PO Take 27 mg by  mouth every other day.    . levothyroxine (SYNTHROID) 50 MCG tablet Take 1 tablet (50 mcg total) by mouth daily. 45 tablet 3  . Multiple Vitamin (MULTIVITAMIN) tablet Take 1 tablet by mouth daily.     No current facility-administered medications on file prior to visit.   Allergies  Allergen Reactions  . Other     Cats  . Permethrin    Family History  Problem Relation Age of Onset  . Hypertension Mother   . Hypertension Father   . Kidney disease Father   . Diabetes Father   . Uterine cancer Maternal Aunt   . Cancer Maternal Aunt        uterine  . Diabetes Maternal Grandmother   . Diabetes Brother   . Hypertension Brother  PE: BP 126/70   Pulse 87   Ht 5\' 4"  (1.626 m)   Wt 182 lb (82.6 kg)   SpO2 98%   BMI 31.24 kg/m  Wt Readings from Last 3 Encounters:  01/05/19 182 lb (82.6 kg)  10/06/18 180 lb (81.6 kg)  05/16/16 161 lb (73 kg)   Constitutional: overweight, in NAD Eyes: PERRLA, EOMI, no exophthalmos ENT: moist mucous membranes, no thyromegaly, no cervical lymphadenopathy Cardiovascular: RRR, No MRG Respiratory: CTA B Gastrointestinal: abdomen soft, NT, ND, BS+ Musculoskeletal: no deformities, strength intact in all 4 Skin: moist, warm, no rashes Neurological: no tremor with outstretched hands, DTR normal in all 4  ASSESSMENT: 1. Hypothyroidism due to Hashimoto's thyroiditis  PLAN:  1. Patient with relatively recently diagnosed subclinical hypothyroidism with positive antithyroid antibodies suggestive of Hashimoto's thyroiditis.  Initially her TSH returned slightly high, at 6.7 3 months prior to our last visit, and she was suggested to start a low dose of levothyroxine.  She then saw a naturopath who recommended increasing the dose of vitamin D, adding selenium, and also started desiccated thyroid extract.  Afterwards, she started to improve her diet by eliminating dairy and reducing gluten.  TFTs normalized.  She  stopped thyrotropin PMG 1 month prior to our  last visit.  I advised her to stay off the supplement. -At last visit, her TSH remains high and we ended up starting levothyroxine.  She was inquiring about desiccated thyroid extract but I explained why treatment with this is not indicated in case she is trying to get pregnant.   - latest thyroid labs reviewed with pt >> normal after starting levothyroxine: Lab Results  Component Value Date   TSH 2.50 12/24/2018   - she continues on LT4 25 mcg daily, did not increase the dose to 50 mcg daily as advised at last visit, as she is having some dizziness after the pill.  We discussed that this may be due to the dyes or the other excipients in the levothyroxine pill.  I advised him to switch to a 50 mcg tablet and cut them in half to get 25 mcg daily - we discussed about taking the thyroid hormone every day, with water, >30 minutes before breakfast, separated by >4 hours from acid reflux medications, calcium, iron, multivitamins. Pt. is taking it correctly. - RTC in 6 mo  - time spent with the patient: 15 minutes, of which >50% was spent in obtaining information about her symptoms, reviewing her previous labs, evaluations, and treatments, counseling her about her condition (please see the discussed topics above), and developing a plan to further investigate and treat it; she had a number of questions which I addressed.  Orders Placed This Encounter  Procedures  . xtpit - TSH  . xtpit - free T4   Philemon Kingdom, MD PhD [facility] Endocrinology

## 2019-02-22 ENCOUNTER — Other Ambulatory Visit: Payer: Self-pay

## 2019-02-22 DIAGNOSIS — Z20822 Contact with and (suspected) exposure to covid-19: Secondary | ICD-10-CM

## 2019-02-23 LAB — NOVEL CORONAVIRUS, NAA: SARS-CoV-2, NAA: NOT DETECTED

## 2019-03-02 ENCOUNTER — Encounter: Payer: Self-pay | Admitting: Internal Medicine

## 2019-04-11 ENCOUNTER — Other Ambulatory Visit: Payer: Self-pay

## 2019-04-11 DIAGNOSIS — Z20822 Contact with and (suspected) exposure to covid-19: Secondary | ICD-10-CM

## 2019-04-12 LAB — SARS-COV-2, NAA 2 DAY TAT

## 2019-04-12 LAB — NOVEL CORONAVIRUS, NAA: SARS-CoV-2, NAA: NOT DETECTED

## 2019-05-09 ENCOUNTER — Ambulatory Visit: Payer: Managed Care, Other (non HMO) | Attending: Internal Medicine

## 2019-05-09 DIAGNOSIS — Z23 Encounter for immunization: Secondary | ICD-10-CM

## 2019-05-09 NOTE — Progress Notes (Signed)
   Covid-19 Vaccination Clinic  Name:  Michelle Dudley    MRN: [id number] DOB: 1975/08/21  05/09/2019  [name] was observed post Covid-19 immunization for 15 minutes without incident. She was provided with Vaccine Information Sheet and instruction to access the V-Safe system.   [name] was instructed to call 911 with any severe reactions post vaccine: Marland Kitchen Difficulty breathing  . Swelling of face and throat  . A fast heartbeat  . A bad rash all over body  . Dizziness and weakness   Immunizations Administered    Name Date Dose VIS Date Route   Pfizer COVID-19 Vaccine 05/09/2019  1:59 PM 0.3 mL 03/11/2018 Intramuscular   Manufacturer: [employer]   Lot: [id number]   [place]: [id number]

## 2019-05-30 ENCOUNTER — Ambulatory Visit: Payer: Managed Care, Other (non HMO) | Attending: Internal Medicine

## 2019-05-30 DIAGNOSIS — Z23 Encounter for immunization: Secondary | ICD-10-CM

## 2019-05-30 NOTE — Progress Notes (Signed)
   Covid-19 Vaccination Clinic  Name:  Michelle Dudley    MRN: [id number] DOB: 1975/11/20  05/30/2019  Michelle Dudley was observed post Covid-19 immunization for 15 minutes without incident. She was provided with Vaccine Information Sheet and instruction to access the V-Safe system.   Michelle Dudley was instructed to call 911 with any severe reactions post vaccine: Marland Kitchen Difficulty breathing  . Swelling of face and throat  . A fast heartbeat  . A bad rash all over body  . Dizziness and weakness   Immunizations Administered    Name Date Dose VIS Date Route   Pfizer COVID-19 Vaccine 05/30/2019 11:24 AM 0.3 mL 03/11/2018 Intramuscular   Manufacturer: [place]   Lot: [id number]   [place]: [id number]

## 2019-06-02 ENCOUNTER — Ambulatory Visit: Payer: Managed Care, Other (non HMO)

## 2019-07-08 ENCOUNTER — Ambulatory Visit (INDEPENDENT_AMBULATORY_CARE_PROVIDER_SITE_OTHER): Payer: Managed Care, Other (non HMO) | Admitting: Internal Medicine

## 2019-07-08 ENCOUNTER — Encounter: Payer: Self-pay | Admitting: Internal Medicine

## 2019-07-08 ENCOUNTER — Other Ambulatory Visit: Payer: Self-pay

## 2019-07-08 DIAGNOSIS — E063 Autoimmune thyroiditis: Secondary | ICD-10-CM

## 2019-07-08 DIAGNOSIS — E038 Other specified hypothyroidism: Secondary | ICD-10-CM

## 2019-07-08 NOTE — Progress Notes (Signed)
Patient ID: Michelle Dudley, female   DOB: [birth year], 44 y.o.   MRN: [id number]   Patient location: Home My location: Office Persons participating in the virtual visit: patient, provider  Referring Provider: Jearld Fenton, NP  I connected with the patient on 07/08/19 at 10:00 AM EDT by a video enabled telemedicine application and verified that I am speaking with the correct person.   I discussed the limitations of evaluation and management by telemedicine and the availability of in person appointments. The patient expressed understanding and agreed to proceed.   Details of the encounter are shown below.  HPI  Michelle Dudley is a 44 y.o.-year-old female, initially referred by Dr. Lysle Rubens, returning for follow-up for  hypothyroidism due to Hashimoto's thyroiditis.  Last visit 6 months ago.  She adopted 2 small babies since last OV>  Reviewed and addended history: She has been diagnosed with hypothyroidism in 05/2018>> PCP recommended to start on Levothyroxine 25 mcg >> she did not start it at that time.   She saw her chiropractor and also an integrative medicine provider and started a higher vit D dose, selenium, Thyrotrophin PMG 1 tab (109 mg).  Her TFTs improved in 07/2018.  However, she stopped the thyroid extract approximately 1 month before last visit.    She was on selenium but we stopped after her TPO antibodies returned higher.  We started her on levothyroxine 09/2018.  Pt is on levothyroxine 25 mcg daily, taken: - in am - fasting - at least 30 min from b'fast - no Ca, Fe, MVI, PPIs (only rarely, at night) - not on Biotin   She had dizziness after she took levothyroxine as she was trying to delay her meal to more than 30 minutes after taking the thyroid hormone.  This happened with the 50 mcg dose.  Reviewed her TFTs: 07/02/2019: TSH 3.79 05/11/2019: TSH 4.83 - restarted Se and MVI, vitamin D, Fe 02/2019: TSH 6.25 Lab Results  Component Value Date   TSH 2.50  12/24/2018   TSH 6.52 (H) 10/06/2018   TSH 2.31 11/15/2015   TSH 2.63 06/06/2015   TSH 3.18 01/28/2015   FREET4 0.61 12/24/2018   FREET4 0.67 10/06/2018   T3FREE 2.4 10/06/2018  08/04/2018: TSH 3.55 06/18/2018: TSH 6.78 (0.45-4.5), fT3 2.6 (2-4.4), TT4 (4.5-12) 06/02/2018: TSH 6.78 02/27/2018: TSH 4.36 (0.34-4.5)  Her thyroid antibodies were elevated: Component     Latest Ref Rng & Units 10/06/2018  Thyroperoxidase Ab SerPl-aCnc     <9 IU/mL 540 (H)  Thyroglobulin Ab     < or = 1 IU/mL 12 (H)   06/22/2018: TPO Ab 284 (0-34) ATA Ab 18.4 (0-0.9)   In the pastIn the past she described fatigue, poor sleep, dry skin.  She felt better after starting levothyroxine.  Pt denies: - feeling nodules in neck - hoarseness - dysphagia - choking - SOB with lying down  She has + FH of thyroid disorders in: father with hyperthyroidism. No FH of thyroid cancer. No h/o radiation tx to head or neck.  No seaweed or kelp. No recent contrast studies. No herbal supplements. No Biotin use. No recent steroids use.   Pt. also has a history of anemia - fibroids, thalassemia minor.  She has regular menstrual cycles.  She and her husband are still contemplating a pregnancy.  ROS: Constitutional: no weight gain/no weight loss, no fatigue, no subjective hyperthermia, no subjective hypothermia Eyes: no blurry vision, no xerophthalmia ENT: no sore throat, + See HPI Cardiovascular: no CP/no  SOB/no palpitations/no leg swelling Respiratory: no cough/no SOB/no wheezing Gastrointestinal: no N/no V/no D/no C/no acid reflux Musculoskeletal: no muscle aches/no joint aches Skin: no rashes, no hair loss Neurological: no tremors/no numbness/no tingling/no dizziness  I reviewed pt's medications, allergies, PMH, social hx, family hx, and changes were documented in the history of present illness. Otherwise, unchanged from my initial visit note.  Past Medical History:  Diagnosis Date  . Allergy-induced asthma     Past Surgical History:  Procedure Laterality Date  . WISDOM TOOTH EXTRACTION     Social History   Socioeconomic History  . Marital status: Married    Spouse name: Gerald Stabs   . Number of children: 0  . Years of education: PhD  . Highest education level: Not on file  Occupational History  . Occupation: [employer]  Tobacco Use  . Smoking status: Never Smoker  . Smokeless tobacco: Never Used  Substance and Sexual Activity  . Alcohol use: Yes    Alcohol/week: 0.0 standard drinks    Comment: 1 drink per month  . Drug use: No  . Sexual activity: Yes    Partners: Male    Birth control/protection: None    Comment: One partner for the past ten years  Other Topics Concern  . Not on file  Social History Narrative   Lives with husband   Caffeine use: Very rarely- tea    Social Determinants of Health   Financial Resource Strain:   . Difficulty of Paying Living Expenses:   Food Insecurity:   . Worried About Charity fundraiser in the Last Year:   . Arboriculturist in the Last Year:   Transportation Needs:   . Film/video editor (Medical):   Marland Kitchen Lack of Transportation (Non-Medical):   Physical Activity:   . Days of Exercise per Week:   . Minutes of Exercise per Session:   Stress:   . Feeling of Stress :   Social Connections:   . Frequency of Communication with Friends and Family:   . Frequency of Social Gatherings with Friends and Family:   . Attends Religious Services:   . Active Member of Clubs or Organizations:   . Attends Archivist Meetings:   Marland Kitchen Marital Status:   Intimate Partner Violence:   . Fear of Current or Ex-Partner:   . Emotionally Abused:   Marland Kitchen Physically Abused:   . Sexually Abused:    Current Outpatient Medications on File Prior to Visit  Medication Sig Dispense Refill  . albuterol (PROVENTIL HFA;VENTOLIN HFA) 108 (90 Base) MCG/ACT inhaler Inhale 1-2 puffs into the lungs every 6 (six) hours as needed for wheezing or shortness of  breath. 1 Inhaler 1  . cholecalciferol (VITAMIN D) 1000 UNITS tablet Take 1,000 Units by mouth every other day.    . IRON PO Take 27 mg by mouth every other day.    . levothyroxine (SYNTHROID) 50 MCG tablet Take 0.5 tablets (25 mcg total) by mouth daily before breakfast. 45 tablet 3  . Multiple Vitamin (MULTIVITAMIN) tablet Take 1 tablet by mouth daily.     No current facility-administered medications on file prior to visit.   Allergies  Allergen Reactions  . Other     Cats  . Permethrin    Family History  Problem Relation Age of Onset  . Hypertension Mother   . Hypertension Father   . Kidney disease Father   . Diabetes Father   . Uterine cancer Maternal Aunt   .  Cancer Maternal Aunt        uterine  . Diabetes Maternal Grandmother   . Diabetes Brother   . Hypertension Brother     PE: There were no vitals taken for this visit. Wt Readings from Last 3 Encounters:  01/05/19 182 lb (82.6 kg)  10/06/18 180 lb (81.6 kg)  05/16/16 161 lb (73 kg)   Constitutional:  in NAD  The physical exam was not performed (virtual visit).  ASSESSMENT: 1. Hypothyroidism due to Hashimoto's thyroiditis  PLAN:  1. Patient with history of subclinical hypothyroidism with positive antithyroid antibodies, suggestive of Hashimoto's thyroiditis.  Initially, her TSH returned high, at 6.7 and she was advised to start a low dose levothyroxine.  She then saw naturopath who recommended decreasing the dose of vitamin D, adding selenium, and also started desiccated thyroid extract.  Afterwards, she started to improve her diet by eliminating dairy and reducing gluten.  TFTs normalized.  She stopped thyrotropin PMG ~08/2018.  I advised her to stay off the supplement. -In 09/2018, her TSH was high, and we ended up starting levothyroxine.  She was inquiring about desiccated thyroid extract but she was also trying to get pregnant and I explained why treatment with this is not indicated in patients desiring a  pregnancy -She had some dizziness with levothyroxine 50 mcg daily and she cut down the dose to 25 mcg daily.  We discussed that the dizziness may be related to the excipients in the pill and the fact that the 50 mcg pill is white, without coloring agents, and she may tolerate this better. - latest thyroid labs reviewed with pt >> normal while on levothyroxine 25 mcg daily but she tells me that afterwards, she stopped taking the medication.  Subsequent TSH was high, at 6.25 at next visit with PCP in 02/2019.  A repeat was 4.58 in 04/2019.  At that time she started a multivitamin and selenium and TSH normalized at her next visit 6 days ago.   -We reviewed his labs together and I explained that it is great news that her TSH normalized off the thyroid medication, however, if she plans to get pregnant, a TSH that is higher than 2.5 is higher than our goal.  We discussed about timeline of baby's brain and thyroid development and why it is important to have a low normal TSH at the beginning of the pregnancy.  As of now, she is not actively trying to get pregnant so for now, she can stay off the levothyroxine and on the selenium.  However, if she is contemplating a pregnancy and stops using protection, I would suggest to restart levothyroxine and stop selenium due to its uncertain effects on a possible pregnancy.  Patient agrees with the plan.  I did advise her to come back for labs in 5 weeks after she starts levothyroxine.  - we discussed about taking the thyroid hormone every day, with water, >30 minutes before breakfast, separated by >4 hours from acid reflux medications, calcium, iron, multivitamins. Pt. is aware how to take it correctly. - I will see her back in 6 months, but sooner for labs, as mentioned above  Philemon Kingdom, MD PhD [facility] Endocrinology

## 2019-07-08 NOTE — Patient Instructions (Signed)
Please continue the multivitamin and selenium for now.  However, if you are actively contemplating a pregnancy, please consider: - Stopping selenium - Starting levothyroxine 25 mcg daily  Take the thyroid hormone every day, with water, at least 30 minutes before breakfast, separated by at least 4 hours from: - acid reflux medications - calcium - iron - multivitamins  If you start levothyroxine we need labs in approximately 5 weeks.  Alternatively, if you are not pregnant, we need labs right away.  Please let me know.  Please come back for a follow-up appointment in 6 months.

## 2019-07-23 ENCOUNTER — Other Ambulatory Visit: Payer: Self-pay

## 2019-07-23 ENCOUNTER — Ambulatory Visit: Payer: Managed Care, Other (non HMO) | Attending: Internal Medicine

## 2019-07-23 DIAGNOSIS — Z20822 Contact with and (suspected) exposure to covid-19: Secondary | ICD-10-CM

## 2019-07-24 LAB — SARS-COV-2, NAA 2 DAY TAT

## 2019-07-24 LAB — NOVEL CORONAVIRUS, NAA: SARS-CoV-2, NAA: NOT DETECTED

## 2019-10-06 ENCOUNTER — Other Ambulatory Visit: Payer: Managed Care, Other (non HMO)

## 2019-10-06 DIAGNOSIS — Z20822 Contact with and (suspected) exposure to covid-19: Secondary | ICD-10-CM

## 2019-10-07 LAB — NOVEL CORONAVIRUS, NAA: SARS-CoV-2, NAA: NOT DETECTED

## 2019-10-07 LAB — SARS-COV-2, NAA 2 DAY TAT

## 2019-10-10 ENCOUNTER — Other Ambulatory Visit: Payer: Self-pay | Admitting: Internal Medicine

## 2019-10-10 ENCOUNTER — Other Ambulatory Visit: Payer: Managed Care, Other (non HMO)

## 2019-10-10 DIAGNOSIS — Z20822 Contact with and (suspected) exposure to covid-19: Secondary | ICD-10-CM

## 2019-10-12 LAB — NOVEL CORONAVIRUS, NAA: SARS-CoV-2, NAA: NOT DETECTED

## 2019-10-12 LAB — SARS-COV-2, NAA 2 DAY TAT

## 2019-11-28 ENCOUNTER — Encounter: Payer: Self-pay | Admitting: Internal Medicine

## 2019-11-30 ENCOUNTER — Other Ambulatory Visit: Payer: Self-pay | Admitting: Internal Medicine

## 2019-11-30 DIAGNOSIS — E038 Other specified hypothyroidism: Secondary | ICD-10-CM

## 2020-01-04 ENCOUNTER — Ambulatory Visit: Payer: Managed Care, Other (non HMO) | Attending: Internal Medicine

## 2020-01-04 DIAGNOSIS — Z23 Encounter for immunization: Secondary | ICD-10-CM

## 2020-01-04 NOTE — Progress Notes (Signed)
   Covid-19 Vaccination Clinic  Name:  Michelle Dudley    MRN: [id number] DOB: 05/26/1975  01/04/2020  [name] was observed post Covid-19 immunization for 15 minutes without incident. She was provided with Vaccine Information Sheet and instruction to access the V-Safe system.   [name] was instructed to call 911 with any severe reactions post vaccine: Marland Kitchen Difficulty breathing  . Swelling of face and throat  . A fast heartbeat  . A bad rash all over body  . Dizziness and weakness   Immunizations Administered    Name Date Dose VIS Date Route   Pfizer COVID-19 Vaccine 01/04/2020  2:16 PM 0.3 mL 11/04/2019 Intramuscular   Manufacturer: [place]   Lot: [id number]   NDC: [address]-3

## 2020-01-22 ENCOUNTER — Other Ambulatory Visit: Payer: Managed Care, Other (non HMO)

## 2020-01-22 DIAGNOSIS — Z20822 Contact with and (suspected) exposure to covid-19: Secondary | ICD-10-CM

## 2020-01-26 LAB — NOVEL CORONAVIRUS, NAA: SARS-CoV-2, NAA: NOT DETECTED

## 2020-11-01 ENCOUNTER — Other Ambulatory Visit: Payer: Self-pay | Admitting: Obstetrics and Gynecology

## 2020-11-01 DIAGNOSIS — D259 Leiomyoma of uterus, unspecified: Secondary | ICD-10-CM

## 2020-11-25 ENCOUNTER — Other Ambulatory Visit: Payer: Self-pay

## 2020-11-25 ENCOUNTER — Ambulatory Visit
Admission: RE | Admit: 2020-11-25 | Discharge: 2020-11-25 | Disposition: A | Discharge status: Home / Self Care | Payer: Managed Care, Other (non HMO) | Source: Ambulatory Visit | Attending: Obstetrics and Gynecology | Admitting: Obstetrics and Gynecology

## 2020-11-25 DIAGNOSIS — D259 Leiomyoma of uterus, unspecified: Secondary | ICD-10-CM

## 2020-11-25 MED ORDER — GADOBENATE DIMEGLUMINE 529 MG/ML IV SOLN
17.0000 mL | Freq: Once | INTRAVENOUS | Status: AC | PRN
  Administered 2020-11-25: 17 mL via INTRAVENOUS

## 2021-10-04 DIAGNOSIS — Z1231 Encounter for screening mammogram for malignant neoplasm of breast: Secondary | ICD-10-CM | POA: Diagnosis not present

## 2021-10-09 DIAGNOSIS — N939 Abnormal uterine and vaginal bleeding, unspecified: Secondary | ICD-10-CM | POA: Diagnosis not present

## 2021-10-09 DIAGNOSIS — D251 Intramural leiomyoma of uterus: Secondary | ICD-10-CM | POA: Diagnosis not present

## 2021-10-09 DIAGNOSIS — D259 Leiomyoma of uterus, unspecified: Secondary | ICD-10-CM | POA: Diagnosis not present

## 2021-10-16 DIAGNOSIS — D259 Leiomyoma of uterus, unspecified: Secondary | ICD-10-CM | POA: Diagnosis not present

## 2021-11-13 DIAGNOSIS — N939 Abnormal uterine and vaginal bleeding, unspecified: Secondary | ICD-10-CM | POA: Diagnosis not present

## 2021-11-13 DIAGNOSIS — D219 Benign neoplasm of connective and other soft tissue, unspecified: Secondary | ICD-10-CM | POA: Diagnosis not present

## 2021-12-21 DIAGNOSIS — T8859XA Other complications of anesthesia, initial encounter: Secondary | ICD-10-CM

## 2021-12-21 DIAGNOSIS — J45909 Unspecified asthma, uncomplicated: Secondary | ICD-10-CM | POA: Diagnosis not present

## 2021-12-21 DIAGNOSIS — D259 Leiomyoma of uterus, unspecified: Secondary | ICD-10-CM | POA: Diagnosis not present

## 2021-12-21 DIAGNOSIS — N939 Abnormal uterine and vaginal bleeding, unspecified: Secondary | ICD-10-CM | POA: Diagnosis not present

## 2021-12-21 DIAGNOSIS — K219 Gastro-esophageal reflux disease without esophagitis: Secondary | ICD-10-CM | POA: Diagnosis not present

## 2021-12-21 DIAGNOSIS — D219 Benign neoplasm of connective and other soft tissue, unspecified: Secondary | ICD-10-CM | POA: Diagnosis not present

## 2021-12-21 HISTORY — DX: Other complications of anesthesia, initial encounter: T88.59XA

## 2021-12-21 HISTORY — PX: DILATION AND CURETTAGE OF UTERUS: SHX78

## 2022-03-14 DIAGNOSIS — Z13220 Encounter for screening for lipoid disorders: Secondary | ICD-10-CM | POA: Diagnosis not present

## 2022-03-14 DIAGNOSIS — E039 Hypothyroidism, unspecified: Secondary | ICD-10-CM | POA: Diagnosis not present

## 2022-03-14 DIAGNOSIS — E559 Vitamin D deficiency, unspecified: Secondary | ICD-10-CM | POA: Diagnosis not present

## 2022-03-14 DIAGNOSIS — Z0000 Encounter for general adult medical examination without abnormal findings: Secondary | ICD-10-CM | POA: Diagnosis not present

## 2022-04-04 DIAGNOSIS — N939 Abnormal uterine and vaginal bleeding, unspecified: Secondary | ICD-10-CM | POA: Diagnosis not present

## 2022-04-04 DIAGNOSIS — D219 Benign neoplasm of connective and other soft tissue, unspecified: Secondary | ICD-10-CM | POA: Diagnosis not present

## 2022-04-06 DIAGNOSIS — D649 Anemia, unspecified: Secondary | ICD-10-CM | POA: Diagnosis not present

## 2022-10-09 LAB — HM MAMMOGRAPHY

## 2022-10-16 ENCOUNTER — Encounter: Payer: Self-pay | Admitting: Plastic Surgery

## 2022-10-16 ENCOUNTER — Ambulatory Visit: Payer: BC Managed Care – PPO | Admitting: Plastic Surgery

## 2022-10-16 VITALS — BP 109/74 | HR 83 | Ht 64.0 in | Wt 193.8 lb

## 2022-10-16 DIAGNOSIS — M545 Low back pain, unspecified: Secondary | ICD-10-CM

## 2022-10-16 DIAGNOSIS — M546 Pain in thoracic spine: Secondary | ICD-10-CM | POA: Diagnosis not present

## 2022-10-16 DIAGNOSIS — Z6832 Body mass index (BMI) 32.0-32.9, adult: Secondary | ICD-10-CM

## 2022-10-16 DIAGNOSIS — N62 Hypertrophy of breast: Secondary | ICD-10-CM

## 2022-10-16 DIAGNOSIS — M549 Dorsalgia, unspecified: Secondary | ICD-10-CM | POA: Insufficient documentation

## 2022-10-16 DIAGNOSIS — E063 Autoimmune thyroiditis: Secondary | ICD-10-CM

## 2022-10-16 DIAGNOSIS — M542 Cervicalgia: Secondary | ICD-10-CM

## 2022-10-16 DIAGNOSIS — R21 Rash and other nonspecific skin eruption: Secondary | ICD-10-CM

## 2022-10-16 DIAGNOSIS — G8929 Other chronic pain: Secondary | ICD-10-CM

## 2022-10-16 NOTE — Progress Notes (Signed)
Patient ID: Michelle Dudley, female    DOB: November 11, 1975, 47 y.o.   MRN: [id number]   Chief Complaint  Patient presents with   Advice Only   Breast Problem    Mammary Hyperplasia: The patient is a 47 y.o. female with a history of mammary hyperplasia for several years.  She has extremely large breasts causing symptoms that include the following: Back pain in the upper and lower back, including neck pain. She pulls or pins her bra straps to provide better lift and relief of the pressure and pain. She notices relief by holding her breast up manually.  Her shoulder straps cause grooves and pain and pressure that requires padding for relief. Pain medication is sometimes required with motrin and tylenol.  Activities that are hindered by enlarged breasts include: exercise and running.  She has tried supportive clothing as well as fitted bras without improvement.  Her breasts are extremely large and fairly symmetric.  She has hyperpigmentation of the inframammary area on both sides.  The sternal to nipple distance on the right is 36 cm and the left is 35 cm.  The IMF distance is 20 cm.  She is 5 feet 4 inches tall and weighs 188 pounds.  The BMI = 32.3 kg/m.  Preoperative bra size = H or I cup. She would likely be a D cup after the surgery. The estimated excess breast tissue to be removed at the time of surgery = 530 grams on the left and 530 grams on the right.  Mammogram history: 10/09/22 negative.  Family history of breast cancer:  no.  Tobacco use:  no.   The patient expresses the desire to pursue surgical intervention.  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  She has had wisdom teeth out in the past without any difficulty.  She has not had physical therapy but is willing to do it.  She also may need uterine surgery so she will be balancing a reduction with that surgery as well.     Review of Systems  Constitutional: Negative.   HENT: Negative.    Eyes: Negative.   Respiratory: Negative.   Negative for chest tightness.   Cardiovascular: Negative.   Gastrointestinal: Negative.   Endocrine: Negative.   Genitourinary: Negative.   Musculoskeletal:  Positive for back pain and neck pain.  Skin:  Positive for rash.    Past Medical History:  Diagnosis Date   Allergy-induced asthma     Past Surgical History:  Procedure Laterality Date   WISDOM TOOTH EXTRACTION        Current Outpatient Medications:    albuterol (PROVENTIL HFA;VENTOLIN HFA) 108 (90 Base) MCG/ACT inhaler, Inhale 1-2 puffs into the lungs every 6 (six) hours as needed for wheezing or shortness of breath., Disp: 1 Inhaler, Rfl: 1   cholecalciferol (VITAMIN D) 1000 UNITS tablet, Take 1,000 Units by mouth every other day., Disp: , Rfl:    ferrous sulfate 325 (65 FE) MG tablet, Take by mouth., Disp: , Rfl:    IRON PO, Take 27 mg by mouth every other day., Disp: , Rfl:    levothyroxine (SYNTHROID) 50 MCG tablet, Take 0.5 tablets (25 mcg total) by mouth daily before breakfast., Disp: 45 tablet, Rfl: 3   Multiple Vitamin (MULTIVITAMIN) tablet, Take 1 tablet by mouth daily., Disp: , Rfl:    Objective:   There were no vitals filed for this visit.  Physical Exam Vitals and nursing note reviewed.  Constitutional:  Appearance: Normal appearance.  HENT:     Head: Normocephalic and atraumatic.  Cardiovascular:     Rate and Rhythm: Normal rate.     Pulses: Normal pulses.  Pulmonary:     Effort: Pulmonary effort is normal.  Abdominal:     General: There is no distension.     Palpations: Abdomen is soft.     Tenderness: There is no abdominal tenderness.  Musculoskeletal:        General: No swelling.  Skin:    General: Skin is warm.     Capillary Refill: Capillary refill takes less than 2 seconds.     Coloration: Skin is not jaundiced.     Findings: No bruising or lesion.  Neurological:     Mental Status: She is alert and oriented to person, place, and time.  Psychiatric:        Mood and Affect: Mood  normal.        Behavior: Behavior normal.        Thought Content: Thought content normal.        Judgment: Judgment normal.    Assessment & Plan:  Cervicalgia  Hypothyroidism due to Hashimoto's thyroiditis  Chronic nonintractable headache, unspecified headache type  Symptomatic mammary hypertrophy  Chronic bilateral thoracic back pain  Neck pain  The procedure the patient selected and that was best for the patient was discussed. The risk were discussed and include but not limited to the following:  Breast asymmetry, fluid accumulation, firmness of the breast, inability to breast feed, loss of nipple or areola, skin loss, change in skin and nipple sensation, fat necrosis of the breast tissue, bleeding, infection and healing delay.  There are risks of anesthesia and injury to nerves or blood vessels.  Allergic reaction to tape, suture and skin glue are possible.  There will be swelling.  Any of these can lead to the need for revisional surgery which is not included in this surgery.  A breast reduction has potential to interfere with diagnostic procedures in the future.  This procedure is best done when the breast is fully developed.  Changes in the breast will continue to occur over time: pregnancy, weight gain or weigh loss. No guarantees are given for a certain bra or breast size.    Total time: 40 minutes. This includes time spent with the patient during the visit as well as time spent before and after the visit reviewing the chart, documenting the encounter, ordering pertinent studies and literature for the patient.   Physical therapy: Ordered Mammogram: Done Healthy Weight and Wellness: Working on weight reduction  She is going to continue to work on her weight reduction and start the physical therapy she will come and see [place] in about 8 weeks and decide her timing for surgery for possible breast reduction with liposuction.  Pictures were obtained of the patient and placed in the chart  with the patient's or guardian's permission.   Alena Bills Kekoa Fyock, DO

## 2022-10-17 ENCOUNTER — Telehealth: Payer: Self-pay

## 2022-10-17 NOTE — Telephone Encounter (Signed)
Faxed release of information form to [facility] to request mammogram results. Received fax success confirmation. Forwarded original order to front desk for batch scanning.

## 2022-12-06 ENCOUNTER — Ambulatory Visit: Payer: BC Managed Care – PPO | Admitting: Student

## 2023-02-01 ENCOUNTER — Encounter: Payer: Self-pay | Admitting: Plastic Surgery

## 2023-02-13 ENCOUNTER — Ambulatory Visit: Payer: 59

## 2023-02-13 ENCOUNTER — Other Ambulatory Visit: Payer: Self-pay | Admitting: Student

## 2023-02-13 DIAGNOSIS — N62 Hypertrophy of breast: Secondary | ICD-10-CM

## 2023-02-13 NOTE — Progress Notes (Signed)
Updated physical therapy order - patient requested new location

## 2023-02-19 ENCOUNTER — Telehealth: Payer: Self-pay | Admitting: *Deleted

## 2023-02-19 NOTE — Telephone Encounter (Signed)
 Received on (02/19/2023) via of fax Plan of Care from [facility] requesting signature and return.  Given to provider to complete.    Plan of Care signed and faxed back to [facility].  Confirmation received and copy scanned into the chart.//AB/CMA

## 2023-02-22 ENCOUNTER — Other Ambulatory Visit: Payer: Self-pay | Admitting: Obstetrics and Gynecology

## 2023-02-22 DIAGNOSIS — D259 Leiomyoma of uterus, unspecified: Secondary | ICD-10-CM

## 2023-03-12 ENCOUNTER — Ambulatory Visit: Payer: BC Managed Care – PPO | Admitting: Plastic Surgery

## 2023-03-13 ENCOUNTER — Ambulatory Visit
Admission: RE | Admit: 2023-03-13 | Discharge: 2023-03-13 | Disposition: A | Discharge status: Home / Self Care | Payer: 59 | Source: Ambulatory Visit | Attending: Obstetrics and Gynecology | Admitting: Obstetrics and Gynecology

## 2023-03-13 DIAGNOSIS — D259 Leiomyoma of uterus, unspecified: Secondary | ICD-10-CM

## 2023-03-13 NOTE — Consult Note (Signed)
 Chief Complaint: Patient was seen in consultation today for symptomatic fibroids at the request of Yalcinkaya,Tamer  Referring Physician(s): Yalcinkaya,Tamer  History of Present Illness: Michelle Dudley is a 48 y.o. female h/o beta thal minor, with known multiple uterine fibroids, referred for preop gelfoam embolization of uterine arteries before robotic myomectomy, with goal  to improve fertility. 08/29/15 CT for abd pain showed Enlarged uterus with multiple fibroids noted.   11/25/20 MR pelvis obtained for fibroid evaluation showed Markedly enlarged uterus (est. 1175cc) secondary to numerous uterine fibroids including intramural lesion measuring up to 5.7 cm. Exophytic   pedunculated left uterine fundus measuring 2.5 x 2.0 x 2.2 cm. No evidence for an intracavitary fibroid. 1.8 cm mucosal fibroid identified along the left endometrium.   Past Medical History:  Diagnosis Date   Allergy-induced asthma    Hypothyroidism Beta thallasemia minor  Past Surgical History:  Procedure Laterality Date   WISDOM TOOTH EXTRACTION      Allergies: Other and Permethrin  Medications: Prior to Admission medications   Medication Sig Start Date End Date Taking? Authorizing Provider  albuterol (PROVENTIL HFA;VENTOLIN HFA) 108 (90 Base) MCG/ACT inhaler Inhale 1-2 puffs into the lungs every 6 (six) hours as needed for wheezing or shortness of breath. 05/04/15   [place], Betty G, MD  cholecalciferol (VITAMIN D) 1000 UNITS tablet Take 1,000 Units by mouth every other day.    [provider]  ferrous sulfate 325 (65 FE) MG tablet Take by mouth. 10/10/21   [provider]  IRON PO Take 27 mg by mouth every other day.    [provider]  levothyroxine (SYNTHROID) 50 MCG tablet Take 0.5 tablets (25 mcg total) by mouth daily before breakfast. 01/05/19   Carlus Pavlov, MD  Multiple Vitamin (MULTIVITAMIN) tablet Take 1 tablet by mouth daily.    [provider]      Family History  Problem Relation Age of Onset   Hypertension Mother    Hypertension Father    Kidney disease Father    Diabetes Father    Uterine cancer Maternal Aunt    Cancer Maternal Aunt        uterine   Diabetes Maternal Grandmother    Diabetes Brother    Hypertension Brother     Social History   Socioeconomic History   Marital status: Married    Spouse name: Thayer Ohm    Number of children: 0   Years of education: PhD   Highest education level: Not on file  Occupational History   Occupation: Scientist, research (physical sciences)  Tobacco Use   Smoking status: Never   Smokeless tobacco: Never  Substance and Sexual Activity   Alcohol use: Yes    Alcohol/week: 0.0 standard drinks of alcohol    Comment: 1 drink per month   Drug use: No   Sexual activity: Yes    Partners: Male    Birth control/protection: None    Comment: One partner for the past ten years  Other Topics Concern   Not on file  Social History Narrative   Lives with husband   Caffeine use: Very rarely- tea    Social Drivers of Corporate investment banker Strain: Not on file  Food Insecurity: Not on file  Transportation Needs: Not on file  Physical Activity: Not on file  Stress: Not on file  Social Connections: Not on file    ECOG Status: 1 - Symptomatic but completely ambulatory  Review of Systems: A 12 point ROS discussed and  pertinent positives are indicated in the HPI above.  All other systems are negative.  Review of Systems  Vital Signs: BP 108/72   Pulse 76   Temp 98.2 F (36.8 C) (Oral)   Resp 17   SpO2 97%     Physical Exam Constitutional: Oriented to person, place, and time. Well-developed and well-nourished. No distress.   HENT:  Head: Normocephalic and atraumatic.  Eyes: Conjunctivae and EOM are normal. Right eye exhibits no discharge. Left eye exhibits no discharge. No scleral icterus.  Neck: No JVD present.  Pulmonary/Chest: Effort normal. No stridor. No respiratory distress.   Abdomen: soft, non distended Neurological:  alert and oriented to person, place, and time.  Skin: Skin is warm and dry.  not diaphoretic.  Psychiatric:   normal mood and affect.   behavior is normal. Judgment and thought content normal.       Imaging: MRI PELVIS WITHOUT AND WITH CONTRAST  TECHNIQUE: Multiplanar multisequence MR imaging of the pelvis was performed both before and after administration of intravenous contrast.  CONTRAST: 17mL MULTIHANCE GADOBENATE DIMEGLUMINE 529 MG/ML IV SOLN  COMPARISON: None.  FINDINGS: Urinary Tract: Bladder is decompressed. No evidence for urethral diverticulum.  Bowel: Unremarkable visualized pelvic bowel loops.  Vascular/Lymphatic: No pathologically enlarged lymph nodes or other significant abnormality.  Reproductive:  Uterus: Measures 8.8 x 17.6 x 14.5 cm. Estimated volume = 1,175 cc. Multiple uterine fibroids evident.  Intracavitary fibroids: No evidence for an intracavitary fibroid.  Pedunculated fibroids: Small diffusely enhancing markedly exophytic to pedunculated fibroid noted left uterine fundus measuring 2.5 x 2.0 x 2.2 cm and well seen on axial postcontrast image 18 of series 15 and coronal T2 image 20 of series 21. The neck of this fibroid measures approximately 2.1 cm.  Fibroid contrast enhancement: All fibroids show relatively diffuse enhancement except for single fibroid in the anterior myometrium of the fundus measuring 3.1 x 2.8 x 3.2 cm which is completely devoid of enhancement. Numerous intramural fibroids are evident including a 5.2 cm intramural lesion in the left uterine body and a 5.7 cm intramural lesion towards the right uterine fundus. 1.8 cm sub mucosal fibroid identified along the left endometrium on axial image 16/series 4.  Right ovary: Right ovary is compressed between the fibroid uterus and the right psoas muscle measuring 3.9 x 1.7 x 4.2 cm without mass lesion.  Left ovary: Left ovary is in the  anterior aspect of the lateral left pelvis measuring 4.0 x 2.0 x 4.0 cm, unremarkable.  Other: Trace free fluid noted in the cul-de-sac.  Musculoskeletal: No focal suspicious marrow enhancement within the visualized bony anatomy.  IMPRESSION: 1. Markedly enlarged uterus secondary to numerous uterine fibroids including intramural lesion measuring up to 5.7 cm. Exophytic to pedunculated left uterine fundus measuring 2.5 x 2.0 x 2.2 cm. No evidence for an intracavitary fibroid. 2. 1.8 cm mucosal fibroid identified along the left endometrium. 3. No adnexal mass. 4. Trace free fluid in the cul-de-sac.   Electronically Signed By: Kennith Center M.D. On: 11/28/2020 07:20   Labs:  CBC: No results for input(s): "WBC", "HGB", "HCT", "PLT" in the last 8760 hours.  COAGS: No results for input(s): "INR", "APTT" in the last 8760 hours.  BMP: No results for input(s): "NA", "K", "CL", "CO2", "GLUCOSE", "BUN", "CALCIUM", "CREATININE", "GFRNONAA", "GFRAA" in the last 8760 hours.  Invalid input(s): "CMP"  LIVER FUNCTION TESTS: No results for input(s): "BILITOT", "AST", "ALT", "ALKPHOS", "PROT", "ALBUMIN" in the last 8760 hours.  TUMOR MARKERS: No results for input(s): "  AFPTM", "CEA", "CA199", "CHROMGRNA" in the last 8760 hours.  Assessment and Plan:  My impression is that this patient is planning myomectomy to increase fertility, and preop bilateral uterine artery embolization with temporary embolic is requested,   to minimize intraprocedural blood loss. We spent the majority of the consultation discussing the pathophysiology of uterine leiomyomata, natural history, anticipated  involution post menopause, and treatment options. We discussed myomectomy, hysterectomy, and uterine artery  embolization. I described the technique of [place], anticipated benefits, possible risks and complications including but not limited to bleeding, infection, vessel damage, nontarget embolization. We discussed the  post procedure course and time course of symptom resolution.   She seemed to understand and did ask appropriate questions, which were answered.  Based on her evaluation thus far, I think she would be an appropriate candidate for preop uterine artery  embolization to minimize blood loss during myomectomy.   We can coordinate the [place] with her planned surgical date.  Thank you for this interesting consult.  I greatly enjoyed meeting Michelle Dudley and look forward to participating in their care.  A copy of this report was sent to the requesting provider on this date.  Electronically Signed: Durwin Glaze 03/13/2023, 4:35 PM   I spent a total of  40 Minutes   in face to face in clinical consultation, greater than 50% of which was counseling/coordinating care for symptomatic uterine fibroids.

## 2023-03-20 ENCOUNTER — Encounter: Payer: Self-pay | Admitting: Student

## 2023-03-20 ENCOUNTER — Ambulatory Visit: Payer: BC Managed Care – PPO | Admitting: Student

## 2023-03-20 VITALS — BP 117/79 | HR 84 | Ht 64.0 in | Wt 182.8 lb

## 2023-03-20 DIAGNOSIS — N62 Hypertrophy of breast: Secondary | ICD-10-CM | POA: Diagnosis not present

## 2023-03-20 NOTE — Progress Notes (Signed)
 Referring Provider Lorre Munroe, NP [address] [place],  [place] [address]   CC:  Chief Complaint  Patient presents with   Follow-up      Michelle Dudley is an 48 y.o. female.  HPI: Patient is a 48 y.o. 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   She was seen for initial consult by Dr. Ulice Bold on 10/16/2022.  At that time, patient reported back and neck pain due to her enlarged breasts.  Her STN on the right was 36 cm and her STN on the left was 35 cm.  Her BMI was 32.3 kg/m and her weight was 188 pounds.  Her preoperative bra size was in a chair night cup, patient reported she would like to be a D cup after surgery.  The estimated amount of excess breast tissue to be removed at the time of surgery was 530 g bilaterally.  Patient expressed the desire to pursue surgical intervention.  Physical therapy was ordered for the patient and patient was also going to work on weight reduction as well.  Today, patient reports she is doing well.  She states that she has completed 2 sessions of physical therapy.  She states that she was not sure the amount of sessions that she needed to complete prior to submitting to insurance.  She reports that although she has completed 2 sessions of physical therapy, she is still experiencing back and shoulder pain.  Patient denies any rashes underneath her breasts, but she does report pigmentation changes underneath her breast.  She does report a little bit of tenderness to her nipples and breasts as well.  She denies any recent fevers, chills or changes in her health.  Patient reports she has been working on her weight loss, and she has lost a few pounds since her previous visit.   Patient states that she is still interested in a breast reduction.  She reports though that she is trying to have a laparoscopic myomectomy performed soon as well.  She states that she is trying to figure out the scheduling of her  procedures, but states that she is leaning towards having the myomectomy first.  Discussed with her she would have to wait a minimum of 6 weeks between the 2 surgeries.  Patient expressed understanding.   Review of Systems General: Denies any fevers, chills or changes in her health MSK: Endorses ongoing back and neck discomfort Skin: Denies rashes, but does report pigment changes.  Physical Exam    03/13/2023   10:34 AM 10/16/2022   10:34 AM 01/05/2019    2:12 PM  Vitals with BMI  Height  5\' 4"  5\' 4"   Weight  193 lbs 13 oz 182 lbs  BMI  33.25 31.22  Systolic 108 109 409  Diastolic 72 74 70  Pulse 76 83 87    General:  No acute distress,  Alert and oriented, Non-Toxic, Normal speech and affect Psych: Normal behavior and mood Respiratory: No increased WOB MSK: Ambulatory  Assessment/Plan  Discussed with the patient that she should continue to go to physical therapy and complete at least 4 more sessions.  After she is completed a total of 6 sessions of physical therapy, we will do a televisit for follow-up after completion of physical therapy.  Patient expressed understanding.  I encouraged the patient to continue to lose weight.  Also recommended the patient follow-up with her primary care provider for breast tenderness and  nipple tenderness.  Patient expressed understanding.  Patient to follow-up for televisit in 1 month.  All of her questions were answered to her satisfaction today.  I instructed her though to call if she has any questions or concerns about anything.  Laurena Spies 03/20/2023, 9:54 AM

## 2023-03-27 ENCOUNTER — Telehealth: Payer: Self-pay

## 2023-03-27 NOTE — Telephone Encounter (Signed)
 Called [facility] and spoke to [place]. Adv that patient is to have PT once a week for 6 weeks per insurance requirements. Michelle Dudley stated that she will call patient and get her back in to complete the remaining 4 visits. Patient has completed 2 sessions.

## 2023-04-19 ENCOUNTER — Telehealth: Admitting: Student

## 2023-04-23 ENCOUNTER — Telehealth: Admitting: Student

## 2023-04-23 DIAGNOSIS — N62 Hypertrophy of breast: Secondary | ICD-10-CM

## 2023-04-23 NOTE — Progress Notes (Signed)
   Referring Provider Lorre Munroe, NP [address] [place],  [place] [address]   CC: Follow up     Michelle Dudley is an 48 y.o. female.  HPI: Patient is a 48 y.o. 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   She was seen for initial consult by Dr. Ulice Bold on 10/16/2022.  At that time, patient reported back and neck pain due to her enlarged breasts.  Her STN on the right was 36 cm and her STN on the left was 35 cm.  Her BMI was 32.3 kg/m and her weight was 188 pounds.  Her preoperative bra size was in a chair night cup, patient reported she would like to be a D cup after surgery.  The estimated amount of excess breast tissue to be removed at the time of surgery was 530 g bilaterally.  Patient expressed the desire to pursue surgical intervention.  Physical therapy was ordered for the patient and patient was also going to work on weight reduction as well.   Patient was last seen in the clinic on 03/20/2023.  At this visit, patient was doing well.  She had completed 2 sessions of physical therapy.  She reported that she was still having back and neck pain.  Patient was still interested in a breast reduction.  She also mentioned that she was planning on having a myomectomy performed.  Today, patient reports she is doing well.  She states that she has completed physical therapy and is still experiencing back and neck pain.  She states that she would still like to move forward with breast reduction.  Patient states that she is did see her gynecologist in regards to laparoscopic myomectomy.  Per the patient, patient is unable to undergo laparoscopic myomectomy due to the extent of her fibroids.  She states that her myomectomy would most likely have to be open, and was referred to a another gynecologist to can perform open myomectomy.  Patient states that she has not yet seen this provider.  Patient was also inquiring if breast reduction and  myomectomy could be done at the same time.  Patient is also requesting her surgery be done in late July.  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  Review of Systems General: Does not report any changes in health MSK: Endorses ongoing back and neck discomfort  Physical Exam Speaking in full and clear sentences.  Assessment/Plan  Patient is interested in pursuing surgical intervention for bilateral breast reduction. 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  Discussed with patient we would submit to insurance for authorization, discussed approval could take up to 6 weeks.   Spoke with Dr. Ulice Bold about a joint case with gynecology for patient's open myomectomy.  She states that this could be possible.  I did discuss with the patient that when she does see the new gynecologist, to let us know to see if we can coordinate.  Patient expressed understanding.  In the meantime, we will go ahead and submit to insurance and try to schedule for July 30 per patient's request.  Laurena Spies 04/23/2023, 2:46 PM

## 2023-04-27 ENCOUNTER — Encounter: Payer: Self-pay | Admitting: Plastic Surgery

## 2023-05-07 ENCOUNTER — Encounter: Payer: Self-pay | Admitting: *Deleted

## 2023-07-25 ENCOUNTER — Encounter: Payer: Self-pay | Admitting: Plastic Surgery

## 2023-08-14 ENCOUNTER — Encounter: Payer: Self-pay | Admitting: Surgical

## 2023-08-14 ENCOUNTER — Ambulatory Visit (INDEPENDENT_AMBULATORY_CARE_PROVIDER_SITE_OTHER): Admitting: Surgical

## 2023-08-14 VITALS — BP 107/73 | HR 76 | Wt 180.0 lb

## 2023-08-14 DIAGNOSIS — M542 Cervicalgia: Secondary | ICD-10-CM

## 2023-08-14 DIAGNOSIS — M546 Pain in thoracic spine: Secondary | ICD-10-CM

## 2023-08-14 DIAGNOSIS — N62 Hypertrophy of breast: Secondary | ICD-10-CM

## 2023-08-14 DIAGNOSIS — G8929 Other chronic pain: Secondary | ICD-10-CM

## 2023-08-14 MED ORDER — ONDANSETRON HCL 4 MG PO TABS: 4.0000 mg | ORAL_TABLET | Freq: Three times a day (TID) | ORAL | 0 refills | Status: DC | PRN

## 2023-08-14 MED ORDER — FLUCONAZOLE 150 MG PO TABS: 150.0000 mg | ORAL_TABLET | Freq: Once | ORAL | 0 refills | Status: AC

## 2023-08-14 MED ORDER — ONDANSETRON 4 MG PO TBDP: 4.0000 mg | ORAL_TABLET | Freq: Three times a day (TID) | ORAL | 0 refills | Status: DC | PRN

## 2023-08-14 MED ORDER — OXYCODONE HCL 5 MG PO TABS: 5.0000 mg | ORAL_TABLET | Freq: Four times a day (QID) | ORAL | 0 refills | Status: AC | PRN

## 2023-08-14 NOTE — Progress Notes (Signed)
 Patient ID: Michelle Dudley, female    DOB: Dec 15, 1975, 48 y.o.   MRN: [id number]  Chief Complaint  Patient presents with   Pre-op Exam      ICD-10-CM   1. 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 Cervicalgia  M54.2     3. Chronic bilateral thoracic back pain  M54.6    G89.29     4. Neck pain  M54.2       History of Present Illness: Michelle Dudley is a 48 y.o.  female  with a history of macromastia.  She presents for preoperative evaluation for upcoming procedure, Bilateral Breast Reduction with liposuction, scheduled for 08/29/2023 with Dr.  Lowery  Patient has had anesthesia in the past for a hysteroscopy and fibroid procedure, she reports she overall tolerated the anesthesia well, but initially was under MAC but had to the be transition to general anesthesia due to an episode of emesis. No history of DVT/PE.  No family history of DVT/PE.  No family or personal history of bleeding or clotting disorders.  Patient is not currently taking any blood thinners.  No history of CVA/MI.  She does not have any history of Crohn's or ulcerative colitis.  She has no personal history of cancer.  She does not have any varicose veins or lower extremity swelling  She denies any cardiac disease.  She does have a history of of asthma diagnosis, but she reports that she has not needed to use albuterol  inhaler in 5 years or more.  She reports she was prescribed albuterol  when she had a cold and thinks that is when she received the asthma diagnosis.  Summary of Previous Visit: The sternal to nipple distance on the right is 36 cm and the left is 35 cm. The IMF distance is 20 cm. She is 5 feet 4 inches tall and weighs 188 pounds. The BMI = 32.3 kg/m. Preoperative bra size = H or I cup. She would likely be a D cup after the surgery. The estimated excess breast tissue to be removed at the time of surgery = 530 grams on the left and 530 grams on the right. Mammogram history: 10/09/22 negative. Family  history of breast cancer: no. Tobacco use: no.   Estimated excess breast tissue to be removed at time of surgery: 530 grams  Job: Professor  PMH Significant for: Macromastia, asthma? (Well-controlled, last inhaler use greater than 5 years ago).  Anemia recent hemoglobin 11-1 year ago, but she reports more recently she had a CBC and reports hemoglobin was about 12.   Past Medical History: Allergies: Allergies  Allergen Reactions   Other     Cats   Permethrin     Current Medications:  Current Outpatient Medications:    albuterol  (PROVENTIL  HFA;VENTOLIN  HFA) 108 (90 Base) MCG/ACT inhaler, Inhale 1-2 puffs into the lungs every 6 (six) hours as needed for wheezing or shortness of breath., Disp: 1 Inhaler, Rfl: 1   cholecalciferol (VITAMIN D) 1000 UNITS tablet, Take 1,000 Units by mouth every other day., Disp: , Rfl:    ferrous sulfate 325 (65 FE) MG tablet, Take by mouth., Disp: , Rfl:    IRON PO, Take 27 mg by mouth every other day., Disp: , Rfl:    levothyroxine  (SYNTHROID ) 50 MCG tablet, Take 0.5 tablets (25 mcg total) by mouth daily before breakfast., Disp: 45 tablet, Rfl: 3   Multiple Vitamin (MULTIVITAMIN) tablet, Take 1 tablet by mouth daily., Disp: , Rfl:  Past Medical Problems: Past Medical History:  Diagnosis Date   Allergy-induced asthma     Past Surgical History: Past Surgical History:  Procedure Laterality Date   WISDOM TOOTH EXTRACTION      Social History: Social History   Socioeconomic History   Marital status: Married    Spouse name: Medford    Number of children: 0   Years of education: PhD   Highest education level: Not on file  Occupational History   Occupation: Scientist, research (physical sciences)  Tobacco Use   Smoking status: Never   Smokeless tobacco: Never  Substance and Sexual Activity   Alcohol use: Yes    Alcohol/week: 0.0 standard drinks of alcohol    Comment: 1 drink per month   Drug use: No   Sexual activity: Yes    Partners: Male    Birth  control/protection: None    Comment: One partner for the past ten years  Other Topics Concern   Not on file  Social History Narrative   Lives with husband   Caffeine use: Very rarely- tea    Social Drivers of Corporate investment banker Strain: Not on file  Food Insecurity: Not on file  Transportation Needs: Not on file  Physical Activity: Not on file  Stress: Not on file  Social Connections: Not on file  Intimate Partner Violence: Not on file    Family History: Family History  Problem Relation Age of Onset   Hypertension Mother    Hypertension Father    Kidney disease Father    Diabetes Father    Uterine cancer Maternal Aunt    Cancer Maternal Aunt        uterine   Diabetes Maternal Grandmother    Diabetes Brother    Hypertension Brother     Review of Systems: Review of Systems  Constitutional: Negative.   Respiratory: Negative.    Cardiovascular: Negative.   Gastrointestinal: Negative.   Neurological: Negative.     Physical Exam: Vital Signs BP 107/73 (BP Location: Right Arm, Patient Position: Standing, Cuff Size: Normal)   Pulse 76   Wt 180 lb (81.6 kg)   SpO2 97%   BMI 30.90 kg/m   Physical Exam Constitutional:      General: Not in acute distress.    Appearance: Normal appearance. Not ill-appearing.  HENT:     Head: Normocephalic and atraumatic.  Eyes:     Pupils: Pupils are equal, round Neck:     Musculoskeletal: Normal range of motion.  Cardiovascular:     Rate and Rhythm: Normal rate    Pulses: Normal pulses.  Pulmonary:     Effort: Pulmonary effort is normal. No respiratory distress.  Musculoskeletal: Normal range of motion.  Skin:    General: Skin is warm and dry.     Findings: No erythema or rash.  Neurological:     General: No focal deficit present.     Mental Status: Alert and oriented to person, place, and time. Mental status is at baseline.     Motor: No weakness.  Psychiatric:        Mood and Affect: Mood normal.         Behavior: Behavior normal.    Assessment/Plan: The patient is scheduled for bilateral breast reduction with Dr. Lowery.  Risks, benefits, and alternatives of procedure discussed, questions answered and consent obtained.    Smoking Status: Non-smoker; Counseling Given?  N/A Last Mammogram: 09/2022; Results: birads 1 negative  Caprini Score: 4, moderate; Risk Factors include:  Age, BMI > 25, and length of planned surgery. Recommendation for mechanical prophylaxis. Encourage early ambulation.   Pictures obtained: @consult   Post-op Rx sent to pharmacy: Oxycodone , Zofran , Keflex and Diflucan   Patient was provided with the breast reduction and General Surgical Risk consent document and Pain Medication Agreement prior to their appointment.  They had adequate time to read through the risk consent documents and Pain Medication Agreement. We also discussed them in person together during this preop appointment. All of their questions were answered to their satisfaction.  Recommended calling if they have any further questions.  Risk consent form and Pain Medication Agreement to be scanned into patient's chart.  The risk that can be encountered with breast reduction were discussed and include the following but not limited to these:  Breast asymmetry, fluid accumulation, firmness of the breast, inability to breast feed, loss of nipple or areola, skin loss, decrease or no nipple sensation, fat necrosis of the breast tissue, bleeding, infection, healing delay.  There are risks of anesthesia, changes to skin sensation and injury to nerves or blood vessels.  The muscle can be temporarily or permanently injured.  You may have an allergic reaction to tape, suture, glue, blood products which can result in skin discoloration, swelling, pain, skin lesions, poor healing.  Any of these can lead to the need for revisonal surgery or stage procedures.  A reduction has potential to interfere with diagnostic procedures.   Nipple or breast piercing can increase risks of infection.  This procedure is best done when the breast is fully developed.  Changes in the breast will continue to occur over time.  Pregnancy can alter the outcomes of previous breast reduction surgery, weight gain and weigh loss can also effect the long term appearance.   The risks that can be encountered with and after liposuction were discussed and include the following but no limited to these:  Asymmetry, fluid accumulation, firmness of the area, fat necrosis with death of fat tissue, bleeding, infection, delayed healing, anesthesia risks, skin sensation changes, injury to structures including nerves, blood vessels, and muscles which may be temporary or permanent, allergies to tape, suture materials and glues, blood products, topical preparations or injected agents, skin and contour irregularities, skin discoloration and swelling, deep vein thrombosis, cardiac and pulmonary complications, pain, which may persist, persistent pain, recurrence of the lesion, poor healing of the incision, possible need for revisional surgery or staged procedures. Thiere can also be persistent swelling, poor wound healing, rippling or loose skin, worsening of cellulite, swelling, and thermal burn or heat injury from ultrasound with the ultrasound-assisted lipoplasty technique. Any change in weight fluctuations can alter the outcome.    Electronically signed by: Donnice PARAS Lannette Avellino, PA-C 08/14/2023 9:53 AM

## 2023-08-14 NOTE — H&P (View-Only) (Signed)
 Patient ID: Michelle Dudley, female    DOB: Dec 15, 1975, 48 y.o.   MRN: [id number]  Chief Complaint  Patient presents with   Pre-op Exam      ICD-10-CM   1. 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 Cervicalgia  M54.2     3. Chronic bilateral thoracic back pain  M54.6    G89.29     4. Neck pain  M54.2       History of Present Illness: Michelle Dudley is a 48 y.o.  female  with a history of macromastia.  She presents for preoperative evaluation for upcoming procedure, Bilateral Breast Reduction with liposuction, scheduled for 08/29/2023 with Dr.  Lowery  Patient has had anesthesia in the past for a hysteroscopy and fibroid procedure, she reports she overall tolerated the anesthesia well, but initially was under MAC but had to the be transition to general anesthesia due to an episode of emesis. No history of DVT/PE.  No family history of DVT/PE.  No family or personal history of bleeding or clotting disorders.  Patient is not currently taking any blood thinners.  No history of CVA/MI.  She does not have any history of Crohn's or ulcerative colitis.  She has no personal history of cancer.  She does not have any varicose veins or lower extremity swelling  She denies any cardiac disease.  She does have a history of of asthma diagnosis, but she reports that she has not needed to use albuterol  inhaler in 5 years or more.  She reports she was prescribed albuterol  when she had a cold and thinks that is when she received the asthma diagnosis.  Summary of Previous Visit: The sternal to nipple distance on the right is 36 cm and the left is 35 cm. The IMF distance is 20 cm. She is 5 feet 4 inches tall and weighs 188 pounds. The BMI = 32.3 kg/m. Preoperative bra size = H or I cup. She would likely be a D cup after the surgery. The estimated excess breast tissue to be removed at the time of surgery = 530 grams on the left and 530 grams on the right. Mammogram history: 10/09/22 negative. Family  history of breast cancer: no. Tobacco use: no.   Estimated excess breast tissue to be removed at time of surgery: 530 grams  Job: Professor  PMH Significant for: Macromastia, asthma? (Well-controlled, last inhaler use greater than 5 years ago).  Anemia recent hemoglobin 11-1 year ago, but she reports more recently she had a CBC and reports hemoglobin was about 12.   Past Medical History: Allergies: Allergies  Allergen Reactions   Other     Cats   Permethrin     Current Medications:  Current Outpatient Medications:    albuterol  (PROVENTIL  HFA;VENTOLIN  HFA) 108 (90 Base) MCG/ACT inhaler, Inhale 1-2 puffs into the lungs every 6 (six) hours as needed for wheezing or shortness of breath., Disp: 1 Inhaler, Rfl: 1   cholecalciferol (VITAMIN D) 1000 UNITS tablet, Take 1,000 Units by mouth every other day., Disp: , Rfl:    ferrous sulfate 325 (65 FE) MG tablet, Take by mouth., Disp: , Rfl:    IRON PO, Take 27 mg by mouth every other day., Disp: , Rfl:    levothyroxine  (SYNTHROID ) 50 MCG tablet, Take 0.5 tablets (25 mcg total) by mouth daily before breakfast., Disp: 45 tablet, Rfl: 3   Multiple Vitamin (MULTIVITAMIN) tablet, Take 1 tablet by mouth daily., Disp: , Rfl:  Past Medical Problems: Past Medical History:  Diagnosis Date   Allergy-induced asthma     Past Surgical History: Past Surgical History:  Procedure Laterality Date   WISDOM TOOTH EXTRACTION      Social History: Social History   Socioeconomic History   Marital status: Married    Spouse name: Medford    Number of children: 0   Years of education: PhD   Highest education level: Not on file  Occupational History   Occupation: Scientist, research (physical sciences)  Tobacco Use   Smoking status: Never   Smokeless tobacco: Never  Substance and Sexual Activity   Alcohol use: Yes    Alcohol/week: 0.0 standard drinks of alcohol    Comment: 1 drink per month   Drug use: No   Sexual activity: Yes    Partners: Male    Birth  control/protection: None    Comment: One partner for the past ten years  Other Topics Concern   Not on file  Social History Narrative   Lives with husband   Caffeine use: Very rarely- tea    Social Drivers of Corporate investment banker Strain: Not on file  Food Insecurity: Not on file  Transportation Needs: Not on file  Physical Activity: Not on file  Stress: Not on file  Social Connections: Not on file  Intimate Partner Violence: Not on file    Family History: Family History  Problem Relation Age of Onset   Hypertension Mother    Hypertension Father    Kidney disease Father    Diabetes Father    Uterine cancer Maternal Aunt    Cancer Maternal Aunt        uterine   Diabetes Maternal Grandmother    Diabetes Brother    Hypertension Brother     Review of Systems: Review of Systems  Constitutional: Negative.   Respiratory: Negative.    Cardiovascular: Negative.   Gastrointestinal: Negative.   Neurological: Negative.     Physical Exam: Vital Signs BP 107/73 (BP Location: Right Arm, Patient Position: Standing, Cuff Size: Normal)   Pulse 76   Wt 180 lb (81.6 kg)   SpO2 97%   BMI 30.90 kg/m   Physical Exam Constitutional:      General: Not in acute distress.    Appearance: Normal appearance. Not ill-appearing.  HENT:     Head: Normocephalic and atraumatic.  Eyes:     Pupils: Pupils are equal, round Neck:     Musculoskeletal: Normal range of motion.  Cardiovascular:     Rate and Rhythm: Normal rate    Pulses: Normal pulses.  Pulmonary:     Effort: Pulmonary effort is normal. No respiratory distress.  Musculoskeletal: Normal range of motion.  Skin:    General: Skin is warm and dry.     Findings: No erythema or rash.  Neurological:     General: No focal deficit present.     Mental Status: Alert and oriented to person, place, and time. Mental status is at baseline.     Motor: No weakness.  Psychiatric:        Mood and Affect: Mood normal.         Behavior: Behavior normal.    Assessment/Plan: The patient is scheduled for bilateral breast reduction with Dr. Lowery.  Risks, benefits, and alternatives of procedure discussed, questions answered and consent obtained.    Smoking Status: Non-smoker; Counseling Given?  N/A Last Mammogram: 09/2022; Results: birads 1 negative  Caprini Score: 4, moderate; Risk Factors include:  Age, BMI > 25, and length of planned surgery. Recommendation for mechanical prophylaxis. Encourage early ambulation.   Pictures obtained: @consult   Post-op Rx sent to pharmacy: Oxycodone , Zofran , Keflex and Diflucan   Patient was provided with the breast reduction and General Surgical Risk consent document and Pain Medication Agreement prior to their appointment.  They had adequate time to read through the risk consent documents and Pain Medication Agreement. We also discussed them in person together during this preop appointment. All of their questions were answered to their satisfaction.  Recommended calling if they have any further questions.  Risk consent form and Pain Medication Agreement to be scanned into patient's chart.  The risk that can be encountered with breast reduction were discussed and include the following but not limited to these:  Breast asymmetry, fluid accumulation, firmness of the breast, inability to breast feed, loss of nipple or areola, skin loss, decrease or no nipple sensation, fat necrosis of the breast tissue, bleeding, infection, healing delay.  There are risks of anesthesia, changes to skin sensation and injury to nerves or blood vessels.  The muscle can be temporarily or permanently injured.  You may have an allergic reaction to tape, suture, glue, blood products which can result in skin discoloration, swelling, pain, skin lesions, poor healing.  Any of these can lead to the need for revisonal surgery or stage procedures.  A reduction has potential to interfere with diagnostic procedures.   Nipple or breast piercing can increase risks of infection.  This procedure is best done when the breast is fully developed.  Changes in the breast will continue to occur over time.  Pregnancy can alter the outcomes of previous breast reduction surgery, weight gain and weigh loss can also effect the long term appearance.   The risks that can be encountered with and after liposuction were discussed and include the following but no limited to these:  Asymmetry, fluid accumulation, firmness of the area, fat necrosis with death of fat tissue, bleeding, infection, delayed healing, anesthesia risks, skin sensation changes, injury to structures including nerves, blood vessels, and muscles which may be temporary or permanent, allergies to tape, suture materials and glues, blood products, topical preparations or injected agents, skin and contour irregularities, skin discoloration and swelling, deep vein thrombosis, cardiac and pulmonary complications, pain, which may persist, persistent pain, recurrence of the lesion, poor healing of the incision, possible need for revisional surgery or staged procedures. Thiere can also be persistent swelling, poor wound healing, rippling or loose skin, worsening of cellulite, swelling, and thermal burn or heat injury from ultrasound with the ultrasound-assisted lipoplasty technique. Any change in weight fluctuations can alter the outcome.    Electronically signed by: Donnice PARAS Lannette Avellino, PA-C 08/14/2023 9:53 AM

## 2023-08-22 ENCOUNTER — Encounter (HOSPITAL_BASED_OUTPATIENT_CLINIC_OR_DEPARTMENT_OTHER): Payer: Self-pay | Admitting: Plastic Surgery

## 2023-08-22 ENCOUNTER — Other Ambulatory Visit: Payer: Self-pay

## 2023-08-22 NOTE — Progress Notes (Signed)
   08/22/23 1453  PAT Phone Screen  Is the patient taking a GLP-1 receptor agonist? No  Do You Have Diabetes? No  Do You Have Hypertension? No  Have You Ever Been to the ER for Asthma? No  Have You Taken Oral Steroids in the Past 3 Months? No  Do you Take Phenteramine or any Other Diet Drugs? No  Recent  Lab Work, EKG, CXR? No  Do you have a history of heart problems? No  Any Recent Hospitalizations? No  Height 5' 4 (1.626 m)  Weight 81.6 kg  Pat Appointment Scheduled No  Reason for No Appointment Not Needed   Following anesthesia note from 12/21/21 found in [facility] reviewed with Dr Cleotilde:  'Shortly after start of procedure, CRNA noted difficulty ventilating via LMA. Dr. Roberts was covering OR at the time and arrived within seconds. They removed LMA and intubated. CRNA reports minimal/moderate emesis after removal of LMA. Pt had had neutralizing sodium bicarbonate prior to OR and emesis was clear. No aspiration was noted around cords during intubation. Pt was stable for the rest of the case and we did not have any difficulty with ventilating or oxygenating.'   Ok to proceed as planned with upcoming surgery at [facility]- Pt instructed strict NPO p MN. Verbalized understanding.

## 2023-08-29 ENCOUNTER — Ambulatory Visit (HOSPITAL_BASED_OUTPATIENT_CLINIC_OR_DEPARTMENT_OTHER): Admitting: Anesthesiology

## 2023-08-29 ENCOUNTER — Ambulatory Visit (HOSPITAL_BASED_OUTPATIENT_CLINIC_OR_DEPARTMENT_OTHER)
Admission: RE | Admit: 2023-08-29 | Discharge: 2023-08-29 | Disposition: A | Discharge status: Home / Self Care | Attending: Plastic Surgery | Admitting: Plastic Surgery

## 2023-08-29 ENCOUNTER — Other Ambulatory Visit: Payer: Self-pay

## 2023-08-29 ENCOUNTER — Encounter (HOSPITAL_BASED_OUTPATIENT_CLINIC_OR_DEPARTMENT_OTHER)
Admission: RE | Disposition: A | Discharge status: Home / Self Care | Payer: Self-pay | Source: Home / Self Care | Attending: Plastic Surgery

## 2023-08-29 ENCOUNTER — Encounter (HOSPITAL_BASED_OUTPATIENT_CLINIC_OR_DEPARTMENT_OTHER): Payer: Self-pay | Admitting: Plastic Surgery

## 2023-08-29 DIAGNOSIS — G8929 Other chronic pain: Secondary | ICD-10-CM | POA: Diagnosis not present

## 2023-08-29 DIAGNOSIS — D649 Anemia, unspecified: Secondary | ICD-10-CM | POA: Diagnosis not present

## 2023-08-29 DIAGNOSIS — N62 Hypertrophy of breast: Secondary | ICD-10-CM | POA: Insufficient documentation

## 2023-08-29 DIAGNOSIS — M542 Cervicalgia: Secondary | ICD-10-CM

## 2023-08-29 DIAGNOSIS — J45909 Unspecified asthma, uncomplicated: Secondary | ICD-10-CM | POA: Diagnosis not present

## 2023-08-29 DIAGNOSIS — Z01818 Encounter for other preprocedural examination: Secondary | ICD-10-CM

## 2023-08-29 DIAGNOSIS — M546 Pain in thoracic spine: Secondary | ICD-10-CM | POA: Diagnosis not present

## 2023-08-29 DIAGNOSIS — M549 Dorsalgia, unspecified: Secondary | ICD-10-CM

## 2023-08-29 HISTORY — DX: Hypothyroidism, unspecified: E03.9

## 2023-08-29 HISTORY — DX: Anemia, unspecified: D64.9

## 2023-08-29 HISTORY — PX: BREAST REDUCTION SURGERY: SHX8

## 2023-08-29 HISTORY — DX: Leiomyoma of uterus, unspecified: D25.9

## 2023-08-29 LAB — POCT PREGNANCY, URINE: Preg Test, Ur: NEGATIVE

## 2023-08-29 SURGERY — BREAST REDUCTION WITH LIPOSUCTION
Anesthesia: General | Site: Breast | Laterality: Bilateral

## 2023-08-29 MED ORDER — ACETAMINOPHEN 10 MG/ML IV SOLN
INTRAVENOUS | Status: AC
  Filled 2023-08-29: qty 100

## 2023-08-29 MED ORDER — FENTANYL CITRATE (PF) 100 MCG/2ML IJ SOLN
INTRAMUSCULAR | Status: DC | PRN
  Administered 2023-08-29: 100 ug via INTRAVENOUS

## 2023-08-29 MED ORDER — DEXMEDETOMIDINE HCL IN NACL 80 MCG/20ML IV SOLN
INTRAVENOUS | Status: DC | PRN
Start: 2023-08-29 — End: 2023-08-29
  Administered 2023-08-29: 8 ug via INTRAVENOUS
  Administered 2023-08-29: 12 ug via INTRAVENOUS

## 2023-08-29 MED ORDER — MIDAZOLAM HCL 2 MG/2ML IJ SOLN
INTRAMUSCULAR | Status: DC | PRN
  Administered 2023-08-29: 2 mg via INTRAVENOUS

## 2023-08-29 MED ORDER — FENTANYL CITRATE (PF) 100 MCG/2ML IJ SOLN
INTRAMUSCULAR | Status: AC
Start: 2023-08-29 — End: 2023-08-29
  Filled 2023-08-29: qty 2

## 2023-08-29 MED ORDER — CEFAZOLIN SODIUM-DEXTROSE 2-4 GM/100ML-% IV SOLN
2.0000 g | INTRAVENOUS | Status: AC
  Administered 2023-08-29: 2 g via INTRAVENOUS

## 2023-08-29 MED ORDER — SODIUM CHLORIDE 0.9% FLUSH
3.0000 mL | INTRAVENOUS | Status: DC | PRN
Start: 2023-08-29 — End: 2023-08-29

## 2023-08-29 MED ORDER — ROCURONIUM BROMIDE 10 MG/ML (PF) SYRINGE
PREFILLED_SYRINGE | INTRAVENOUS | Status: AC
  Filled 2023-08-29: qty 10

## 2023-08-29 MED ORDER — BUPIVACAINE LIPOSOME 1.3 % IJ SUSP
INTRAMUSCULAR | Status: DC | PRN
  Administered 2023-08-29: 40 mL

## 2023-08-29 MED ORDER — ONDANSETRON HCL 4 MG/2ML IJ SOLN
INTRAMUSCULAR | Status: AC
  Filled 2023-08-29: qty 2

## 2023-08-29 MED ORDER — DEXAMETHASONE SODIUM PHOSPHATE 10 MG/ML IJ SOLN
INTRAMUSCULAR | Status: DC | PRN
  Administered 2023-08-29: 10 mg via INTRAVENOUS

## 2023-08-29 MED ORDER — FENTANYL CITRATE (PF) 100 MCG/2ML IJ SOLN: 25.0000 ug | INTRAMUSCULAR | Status: DC | PRN

## 2023-08-29 MED ORDER — SODIUM CHLORIDE 0.9 % IV SOLN
250.0000 mL | INTRAVENOUS | Status: DC | PRN
Start: 2023-08-29 — End: 2023-08-29

## 2023-08-29 MED ORDER — OXYCODONE HCL 5 MG/5ML PO SOLN: 5.0000 mg | Freq: Once | ORAL | Status: AC | PRN

## 2023-08-29 MED ORDER — LIDOCAINE HCL 1 % IJ SOLN
INTRAVENOUS | Status: DC | PRN
  Administered 2023-08-29: 300 mL

## 2023-08-29 MED ORDER — SODIUM CHLORIDE 0.9% FLUSH: 3.0000 mL | Freq: Two times a day (BID) | INTRAVENOUS | Status: DC

## 2023-08-29 MED ORDER — LIDOCAINE 2% (20 MG/ML) 5 ML SYRINGE
INTRAMUSCULAR | Status: AC
  Filled 2023-08-29: qty 5

## 2023-08-29 MED ORDER — CHLORHEXIDINE GLUCONATE CLOTH 2 % EX PADS
6.0000 | MEDICATED_PAD | Freq: Once | CUTANEOUS | Status: AC
  Administered 2023-08-29: 6 via TOPICAL

## 2023-08-29 MED ORDER — DROPERIDOL 2.5 MG/ML IJ SOLN
0.6250 mg | Freq: Once | INTRAMUSCULAR | Status: AC | PRN
  Administered 2023-08-29: 0.625 mg via INTRAVENOUS

## 2023-08-29 MED ORDER — DEXAMETHASONE SODIUM PHOSPHATE 10 MG/ML IJ SOLN
INTRAMUSCULAR | Status: AC
  Filled 2023-08-29: qty 1

## 2023-08-29 MED ORDER — LIDOCAINE-EPINEPHRINE 1 %-1:100000 IJ SOLN
INTRAMUSCULAR | Status: DC | PRN
  Administered 2023-08-29: 39 mL

## 2023-08-29 MED ORDER — ACETAMINOPHEN 500 MG PO TABS
1000.0000 mg | ORAL_TABLET | Freq: Once | ORAL | Status: AC
  Administered 2023-08-29: 1000 mg via ORAL

## 2023-08-29 MED ORDER — SUGAMMADEX SODIUM 200 MG/2ML IV SOLN
INTRAVENOUS | Status: DC | PRN
  Administered 2023-08-29: 200 mg via INTRAVENOUS

## 2023-08-29 MED ORDER — OXYCODONE HCL 5 MG PO TABS
ORAL_TABLET | ORAL | Status: AC
  Filled 2023-08-29: qty 1

## 2023-08-29 MED ORDER — MIDAZOLAM HCL 2 MG/2ML IJ SOLN
INTRAMUSCULAR | Status: AC
  Filled 2023-08-29: qty 2

## 2023-08-29 MED ORDER — FENTANYL CITRATE (PF) 100 MCG/2ML IJ SOLN
25.0000 ug | INTRAMUSCULAR | Status: DC | PRN
  Administered 2023-08-29 (×2): 50 ug via INTRAVENOUS

## 2023-08-29 MED ORDER — ONDANSETRON HCL 4 MG/2ML IJ SOLN
INTRAMUSCULAR | Status: DC | PRN
  Administered 2023-08-29: 4 mg via INTRAVENOUS

## 2023-08-29 MED ORDER — OXYCODONE HCL 5 MG PO TABS: 5.0000 mg | ORAL_TABLET | ORAL | Status: DC | PRN

## 2023-08-29 MED ORDER — LIDOCAINE 2% (20 MG/ML) 5 ML SYRINGE
INTRAMUSCULAR | Status: DC | PRN
  Administered 2023-08-29: 40 mg via INTRAVENOUS

## 2023-08-29 MED ORDER — PHENYLEPHRINE 80 MCG/ML (10ML) SYRINGE FOR IV PUSH (FOR BLOOD PRESSURE SUPPORT)
PREFILLED_SYRINGE | INTRAVENOUS | Status: AC
  Filled 2023-08-29: qty 10

## 2023-08-29 MED ORDER — ACETAMINOPHEN 10 MG/ML IV SOLN: 1000.0000 mg | Freq: Once | INTRAVENOUS | Status: DC | PRN

## 2023-08-29 MED ORDER — ACETAMINOPHEN 325 MG RE SUPP: 650.0000 mg | RECTAL | Status: DC | PRN

## 2023-08-29 MED ORDER — LACTATED RINGERS IV SOLN: INTRAVENOUS | Status: DC

## 2023-08-29 MED ORDER — PROPOFOL 10 MG/ML IV BOLUS
INTRAVENOUS | Status: DC | PRN
  Administered 2023-08-29: 200 mg via INTRAVENOUS

## 2023-08-29 MED ORDER — OXYCODONE HCL 5 MG PO TABS
5.0000 mg | ORAL_TABLET | Freq: Once | ORAL | Status: AC | PRN
  Administered 2023-08-29: 5 mg via ORAL

## 2023-08-29 MED ORDER — SCOPOLAMINE 1 MG/3DAYS TD PT72: 1.0000 | MEDICATED_PATCH | TRANSDERMAL | Status: DC

## 2023-08-29 MED ORDER — DROPERIDOL 2.5 MG/ML IJ SOLN
INTRAMUSCULAR | Status: AC
  Filled 2023-08-29: qty 2

## 2023-08-29 MED ORDER — PHENYLEPHRINE 80 MCG/ML (10ML) SYRINGE FOR IV PUSH (FOR BLOOD PRESSURE SUPPORT)
PREFILLED_SYRINGE | INTRAVENOUS | Status: DC | PRN
  Administered 2023-08-29 (×3): 80 ug via INTRAVENOUS

## 2023-08-29 MED ORDER — ROCURONIUM BROMIDE 10 MG/ML (PF) SYRINGE
PREFILLED_SYRINGE | INTRAVENOUS | Status: DC | PRN
  Administered 2023-08-29: 50 mg via INTRAVENOUS

## 2023-08-29 MED ORDER — ACETAMINOPHEN 325 MG PO TABS: 650.0000 mg | ORAL_TABLET | ORAL | Status: DC | PRN

## 2023-08-29 MED ORDER — CEFAZOLIN SODIUM-DEXTROSE 2-4 GM/100ML-% IV SOLN
INTRAVENOUS | Status: AC
  Filled 2023-08-29: qty 100

## 2023-08-29 MED ORDER — PROPOFOL 10 MG/ML IV BOLUS
INTRAVENOUS | Status: AC
  Filled 2023-08-29: qty 20

## 2023-08-29 MED ORDER — VASHE WOUND IRRIGATION OPTIME
TOPICAL | Status: DC | PRN
  Administered 2023-08-29: 34 [oz_av]

## 2023-08-29 SURGICAL SUPPLY — 65 items
BINDER BREAST LRG (GAUZE/BANDAGES/DRESSINGS) IMPLANT
BINDER BREAST MEDIUM (GAUZE/BANDAGES/DRESSINGS) IMPLANT
BINDER BREAST XLRG (GAUZE/BANDAGES/DRESSINGS) IMPLANT
BINDER BREAST XXLRG (GAUZE/BANDAGES/DRESSINGS) IMPLANT
BIOPATCH RED 1 DISK 7.0 (GAUZE/BANDAGES/DRESSINGS) IMPLANT
BLADE HEX COATED 2.75 (ELECTRODE) IMPLANT
BLADE KNIFE PERSONA 10 (BLADE) ×2 IMPLANT
BLADE SURG 15 STRL LF DISP TIS (BLADE) ×1 IMPLANT
CANISTER SUCT 1200ML W/VALVE (MISCELLANEOUS) ×1 IMPLANT
CLEANSER WND VASHE 34 (WOUND CARE) ×1 IMPLANT
COTTONBALL LRG STERILE PKG (GAUZE/BANDAGES/DRESSINGS) IMPLANT
COVER BACK TABLE 60X90IN (DRAPES) ×1 IMPLANT
COVER MAYO STAND STRL (DRAPES) ×1 IMPLANT
DERMABOND ADVANCED .7 DNX12 (GAUZE/BANDAGES/DRESSINGS) ×2 IMPLANT
DRAIN CHANNEL 15F RND FF W/TCR (WOUND CARE) IMPLANT
DRAIN CHANNEL 19F RND (DRAIN) IMPLANT
DRAPE LAPAROSCOPIC ABDOMINAL (DRAPES) ×1 IMPLANT
DRAPE UTILITY XL STRL (DRAPES) ×1 IMPLANT
DRSG MEPILEX POST OP 4X8 (GAUZE/BANDAGES/DRESSINGS) ×2 IMPLANT
DRSG TEGADERM 4X4.75 (GAUZE/BANDAGES/DRESSINGS) IMPLANT
DRSG TELFA 3X8 NADH STRL (GAUZE/BANDAGES/DRESSINGS) IMPLANT
ELECTRODE BLDE 4.0 EZ CLN MEGD (MISCELLANEOUS) ×1 IMPLANT
ELECTRODE REM PT RTRN 9FT ADLT (ELECTROSURGICAL) ×1 IMPLANT
EVACUATOR SILICONE 100CC (DRAIN) IMPLANT
GAUZE PAD ABD 8X10 STRL (GAUZE/BANDAGES/DRESSINGS) ×2 IMPLANT
GAUZE XEROFORM 5X9 LF (GAUZE/BANDAGES/DRESSINGS) IMPLANT
GLOVE BIO SURGEON STRL SZ 6.5 (GLOVE) ×2 IMPLANT
GLOVE BIO SURGEON STRL SZ7.5 (GLOVE) ×1 IMPLANT
GLOVE BIOGEL PI IND STRL 7.0 (GLOVE) IMPLANT
GLOVE BIOGEL PI IND STRL 8 (GLOVE) IMPLANT
GOWN STRL REUS W/ TWL LRG LVL3 (GOWN DISPOSABLE) ×2 IMPLANT
GOWN STRL REUS W/ TWL XL LVL3 (GOWN DISPOSABLE) IMPLANT
LINER CANISTER 1000CC FLEX (MISCELLANEOUS) ×1 IMPLANT
NDL FILTER BLUNT 18X1 1/2 (NEEDLE) IMPLANT
NDL HYPO 25X1 1.5 SAFETY (NEEDLE) ×2 IMPLANT
NEEDLE FILTER BLUNT 18X1 1/2 (NEEDLE) ×1 IMPLANT
NEEDLE HYPO 25X1 1.5 SAFETY (NEEDLE) ×2 IMPLANT
NS IRRIG 1000ML POUR BTL (IV SOLUTION) IMPLANT
PACK BASIN DAY SURGERY FS (CUSTOM PROCEDURE TRAY) ×1 IMPLANT
PAD ALCOHOL SWAB (MISCELLANEOUS) IMPLANT
PAD FOAM SILICONE BACKED (GAUZE/BANDAGES/DRESSINGS) IMPLANT
PENCIL SMOKE EVACUATOR (MISCELLANEOUS) ×1 IMPLANT
PIN SAFETY STERILE (MISCELLANEOUS) IMPLANT
POWDER MYRIAD MORCLLS FINE 500 (Miscellaneous) IMPLANT
SLEEVE SCD COMPRESS KNEE MED (STOCKING) ×1 IMPLANT
SPIKE FLUID TRANSFER (MISCELLANEOUS) IMPLANT
SPONGE T-LAP 18X18 ~~LOC~~+RFID (SPONGE) ×2 IMPLANT
STRIP SUTURE WOUND CLOSURE 1/2 (MISCELLANEOUS) ×4 IMPLANT
SUT MNCRL AB 4-0 PS2 18 (SUTURE) ×4 IMPLANT
SUT MON AB 3-0 SH27 (SUTURE) ×4 IMPLANT
SUT MON AB 5-0 PS2 18 (SUTURE) IMPLANT
SUT PDS II 3-0 CT2 27 ABS (SUTURE) ×4 IMPLANT
SUT SILK 3 0 PS 1 (SUTURE) IMPLANT
SUT VIC AB 4-0 PS2 27 (SUTURE) IMPLANT
SYR 50ML LL SCALE MARK (SYRINGE) IMPLANT
SYR BULB IRRIG 60ML STRL (SYRINGE) ×1 IMPLANT
SYR CONTROL 10ML LL (SYRINGE) ×2 IMPLANT
TAPE MEASURE VINYL STERILE (MISCELLANEOUS) IMPLANT
TOWEL GREEN STERILE FF (TOWEL DISPOSABLE) ×3 IMPLANT
TRAY DSU PREP LF (CUSTOM PROCEDURE TRAY) ×1 IMPLANT
TUBE CONNECTING 20X1/4 (TUBING) ×1 IMPLANT
TUBING INFILTRATION IT-10001 (TUBING) IMPLANT
TUBING SET GRADUATE ASPIR 12FT (MISCELLANEOUS) IMPLANT
UNDERPAD 30X36 HEAVY ABSORB (UNDERPADS AND DIAPERS) ×2 IMPLANT
YANKAUER SUCT BULB TIP NO VENT (SUCTIONS) ×1 IMPLANT

## 2023-08-29 NOTE — Anesthesia Procedure Notes (Signed)
 Procedure Name: Intubation Date/Time: 08/29/2023 8:44 AM  Performed by: Delayne Olam BIRCH, CRNAPre-anesthesia Checklist: Patient identified, Emergency Drugs available, Suction available and Patient being monitored Patient Re-evaluated:Patient Re-evaluated prior to induction Oxygen Delivery Method: Circle system utilized Preoxygenation: Pre-oxygenation with 100% oxygen Induction Type: IV induction Ventilation: Mask ventilation without difficulty Laryngoscope Size: Mac and 4 Grade View: Grade I Tube type: Oral Tube size: 7.0 mm Number of attempts: 1 Airway Equipment and Method: Stylet and Oral airway Placement Confirmation: ETT inserted through vocal cords under direct vision, positive ETCO2 and breath sounds checked- equal and bilateral Secured at: 22 cm Tube secured with: Tape Dental Injury: Teeth and Oropharynx as per pre-operative assessment

## 2023-08-29 NOTE — Op Note (Signed)
 Breast Reduction Op note:    DATE OF PROCEDURE: 08/29/2023  LOCATION: [facility]  SURGEON: Estefana Fritter, DO  ASSISTANT: Matt Scheeler, PA  PREOPERATIVE DIAGNOSIS 1. Macromastia 2. Neck Pain 3. Back Pain  POSTOPERATIVE DIAGNOSIS 1. Macromastia 2. Neck Pain 3. Back Pain  PROCEDURES 1. Bilateral breast reduction.  Right reduction 1046 g, Left reduction 941 g  COMPLICATIONS: None.  DRAINS: none  INDICATIONS FOR PROCEDURE Michelle Dudley is a 48 y.o. year-old female born on [birth year],with a history of symptomatic macromastia with concomitant back pain, neck pain, shoulder grooving from her bra.   MRN: [id number]  CONSENT Informed consent was obtained directly from the patient. The risks, benefits and alternatives were fully discussed. Specific risks including but not limited to bleeding, infection, hematoma, seroma, scarring, pain, nipple necrosis, asymmetry, poor cosmetic results, and need for further surgery were discussed. The patient's questions were answered.  DESCRIPTION OF PROCEDURE  Patient was brought into the operating room and rested on the operating room table in the supine position.  SCDs were placed and appropriate padding was performed.  Antibiotics were given. The patient underwent general anesthesia and the chest was prepped and draped in a sterile fashion.  A timeout was performed and all information was confirmed to be correct by those in the room. Tumescent was placed in the lateral breast area.  Liposuction was done laterally on each breast.  Right side: Preoperative markings were confirmed.  Incision lines were injected with local containing epinephrine .  After waiting for vasoconstriction, the marked lines were incised with a #15 blade.  A Wise-pattern superomedial breast reduction was performed by de-epithelializing the pedicle, using bovie to create the superomedial pedicle, and removing breast tissue from the lateral and inferior  portions of the breast.  Care was taken to not undermine the breast pedicle. Hemostasis was achieved. Experel and myriad were placed in the pocket.  The nipple was gently rotated into position and the soft tissue closed with 4-0 Monocryl.  The pocket was irrigated and hemostasis confirmed.  The deep tissues were approximated with 3-0 PDS sutures.  The skin was closed with deep dermal 3-0 Monocryl and subcuticular 4-0 Monocryl sutures.  The nipple and skin flaps had good capillary refill at the end of the procedure.    Left side: Preoperative markings were confirmed.  Incision lines were injected with local containing epinephrine .  After waiting for vasoconstriction, the marked lines were incised with a #15 blade.  A Wise-pattern superomedial breast reduction was performed by de-epithelializing the pedicle, using bovie to create the superomedial pedicle, and removing breast tissue from the lateral and inferior portions of the breast.  Experel and myriad were placed in the pocket. Care was taken to not undermine the breast pedicle. Hemostasis was achieved.  The nipple was gently rotated into position and the soft tissue was closed with 4-0 Monocryl.  The patient was sat upright and size and shape symmetry was confirmed.  The pocket was irrigated and hemostasis confirmed.  The deep tissues were approximated with 3-0 PDS sutures. The skin was closed with deep dermal 3-0 Monocryl and subcuticular 4-0 Monocryl sutures.  Dermabond was applied.  A breast binder and ABDs were placed.  The nipple and skin flaps had good capillary refill at the end of the procedure.  The patient tolerated the procedure well. The patient was allowed to wake from anesthesia and taken to the recovery room in satisfactory condition.  The advanced practice practitioner (APP) assisted throughout the case.  The APP was essential in retraction and counter traction when needed to make the case progress smoothly.  This retraction and assistance made  it possible to see the tissue plans for the procedure.  The assistance was needed for blood control, tissue re-approximation and assisted with closure of the incision site.

## 2023-08-29 NOTE — Transfer of Care (Signed)
 Immediate Anesthesia Transfer of Care Note  Patient: Michelle Dudley  Procedure(s) Performed: Procedure(s) (LRB): BREAST REDUCTION WITH LIPOSUCTION (Bilateral)  Patient Location: PACU  Anesthesia Type: General  Level of Consciousness: awake, oriented, sedated and patient cooperative  Airway & Oxygen Therapy: Patient Spontanous Breathing and Patient connected to face mask oxygen  Post-op Assessment: Report given to PACU RN and Post -op Vital signs reviewed and stable  Post vital signs: Reviewed and stable  Complications: No apparent anesthesia complications Last Vitals:  Vitals Value Taken Time  BP 111/57 08/29/23 10:38  Temp    Pulse 79 08/29/23 10:42  Resp 16 08/29/23 10:42  SpO2 100 % 08/29/23 10:42  Vitals shown include unfiled device data.  Last Pain:  Vitals:   08/29/23 0740  TempSrc: Temporal  PainSc: 0-No pain      Patients Stated Pain Goal: 3 (08/29/23 0740)  Complications: No notable events documented.

## 2023-08-29 NOTE — Interval H&P Note (Signed)
 History and Physical Interval Note:  08/29/2023 7:53 AM  Michelle Dudley  has presented today for surgery, with the diagnosis of Hypertrophy of breast.  The various methods of treatment have been discussed with the patient and family. After consideration of risks, benefits and other options for treatment, the patient has consented to  Procedure(s): BREAST REDUCTION WITH LIPOSUCTION (Bilateral) as a surgical intervention.  The patient's history has been reviewed, patient examined, no change in status, stable for surgery.  I have reviewed the patient's chart and labs.  Questions were answered to the patient's satisfaction.     Michelle Dudley

## 2023-08-29 NOTE — Discharge Instructions (Addendum)
 INSTRUCTIONS FOR AFTER BREAST SURGERY   You will likely have some questions about what to expect following your operation.  The following information will help you and your family understand what to expect when you are discharged from the hospital.  It is important to follow these guidelines to help ensure a smooth recovery and reduce complication.  Postoperative instructions include information on: diet, wound care, medications and physical activity.  AFTER SURGERY Expect to go home after the procedure.  In some cases, you may need to spend one night in the hospital for observation.  DIET Breast surgery does not require a specific diet.  However, the healthier you eat the better your body will heal. It is important to increasing your protein intake.  This means limiting the foods with sugar and carbohydrates.  Focus on vegetables and some meat.  If you have liposuction during your procedure be sure to drink water.  If your urine is bright yellow, then it is concentrated, and you need to drink more water.  As a general rule after surgery, you should have 8 ounces of water every hour while awake.  If you find you are persistently nauseated or unable to take in liquids let us  know.  NO TOBACCO USE or EXPOSURE.  This will slow your healing process and lead to a wound.  WOUND CARE Leave the binder on for 3 days . Use fragrance free soap like Dial, Dove or [place].   After 3 days you can remove the binder to shower. Once dry apply binder or sports bra. If you have liposuction you will have a soft and spongy dressing (Lipofoam) that helps prevent creases in your skin.  Remove before you shower and then replace it.  It is also available on [employer]. If you have steri-strips / tape directly attached to your skin leave them in place. It is OK to get these wet.   No baths, pools or hot tubs for four weeks. We close your incision to leave the smallest and best-looking scar. No ointment or creams on your incisions  for four weeks.  No Neosporin (Too many skin reactions).  A few weeks after surgery you can use Mederma and start massaging the scar. We ask you to wear your binder or sports bra for the first 6 weeks around the clock, including while sleeping. This provides added comfort and helps reduce the fluid accumulation at the surgery site. NO Ice or heating pads to the operative site.  You have a very high risk of a BURN before you feel the temperature change.  ACTIVITY No heavy lifting until cleared by the doctor.  This usually means no more than a half-gallon of milk.  It is OK to walk and climb stairs. Moving your legs is very important to decrease your risk of a blood clot.  It will also help keep you from getting deconditioned.  Every 1 to 2 hours get up and walk for 5 minutes. This will help with a quicker recovery back to normal.  Let pain be your guide so you don't do too much.  This time is for you to recover.  You will be more comfortable if you sleep and rest with your head elevated either with a few pillows under you or in a recliner.  No stomach sleeping for a three months.  WORK Everyone returns to work at different times. As a rough guide, most people take at least 1 - 2 weeks off prior to returning to work. If  you need documentation for your job, give the forms to the front staff at the clinic.  DRIVING Arrange for someone to bring you home from the hospital after your surgery.  You may be able to drive a few days after surgery but not while taking any narcotics or valium.  BOWEL MOVEMENTS Constipation can occur after anesthesia and while taking pain medication.  It is important to stay ahead for your comfort.  We recommend taking Milk of Magnesia (2 tablespoons; twice a day) while taking the pain pills.  MEDICATIONS You may be prescribed should start after surgery At your preoperative visit for you history and physical you may have been given the following medications: An antibiotic: Start  this medication when you get home and take according to the instructions on the bottle. Zofran  4 mg:  This is to treat nausea and vomiting.  You can take this every 6 hours as needed and only if needed. Valium 2 mg for breast cancer patients: This is for muscle tightness if you have an implant or expander. This will help relax your muscle which also helps with pain control.  This can be taken every 12 hours as needed. Don't drive after taking this medication. Norco (hydrocodone/acetaminophen ) 5/325 mg:  This is only to be used after you have taken the Motrin or the Tylenol . Every 8 hours as needed.   Over the counter Medication to take: Ibuprofen (Motrin) 600 mg:  Take this every 6 hours.  If you have additional pain then take 500 mg of the Tylenol  every 8 hours.  Only take the Norco after you have tried these two. MiraLAX or Milk of Magnesia: Take this according to the bottle if you take the Norco.  WHEN TO CALL Call your surgeon's office if any of the following occur: Fever 101 degrees F or greater Excessive bleeding or fluid from the incision site. Pain that increases over time without aid from the medications Redness, warmth, or pus draining from incision sites Persistent nausea or inability to take in liquids Severe misshapen area that underwent the operation.   Post Anesthesia Home Care Instructions  Activity: Get plenty of rest for the remainder of the day. A responsible individual must stay with you for 24 hours following the procedure.  For the next 24 hours, DO NOT: -Drive a car -Advertising copywriter -Drink alcoholic beverages -Take any medication unless instructed by your physician -Make any legal decisions or sign important papers.  Meals: Start with liquid foods such as gelatin or soup. Progress to regular foods as tolerated. Avoid greasy, spicy, heavy foods. If nausea and/or vomiting occur, drink only clear liquids until the nausea and/or vomiting subsides. Call your  physician if vomiting continues.  Special Instructions/Symptoms: Your throat may feel dry or sore from the anesthesia or the breathing tube placed in your throat during surgery. If this causes discomfort, gargle with warm salt water. The discomfort should disappear within 24 hours.  If you had a scopolamine  patch placed behind your ear for the management of post- operative nausea and/or vomiting:  1. The medication in the patch is effective for 72 hours, after which it should be removed.  Wrap patch in a tissue and discard in the trash. Wash hands thoroughly with soap and water. 2. You may remove the patch earlier than 72 hours if you experience unpleasant side effects which may include dry mouth, dizziness or visual disturbances. 3. Avoid touching the patch. Wash your hands with soap and water after contact with the  patch.     Information for Discharge Teaching: EXPAREL  (bupivacaine  liposome injectable suspension)   Pain relief is important to your recovery. The goal is to control your pain so you can move easier and return to your normal activities as soon as possible after your procedure. Your physician may use several types of medicines to manage pain, swelling, and more.  Your surgeon or anesthesiologist gave you EXPAREL (bupivacaine ) to help control your pain after surgery.  EXPAREL  is a local anesthetic designed to release slowly over an extended period of time to provide pain relief by numbing the tissue around the surgical site. EXPAREL  is designed to release pain medication over time and can control pain for up to 72 hours. Depending on how you respond to EXPAREL , you may require less pain medication during your recovery. EXPAREL  can help reduce or eliminate the need for opioids during the first few days after surgery when pain relief is needed the most. EXPAREL  is not an opioid and is not addictive. It does not cause sleepiness or sedation.   Important! A teal colored band has been  placed on your arm with the date, time and amount of EXPAREL  you have received. Please leave this armband in place for the full 96 hours following administration, and then you may remove the band. If you return to the hospital for any reason within 96 hours following the administration of EXPAREL , the armband provides important information that your health care providers to know, and alerts them that you have received this anesthetic.    Possible side effects of EXPAREL : Temporary loss of sensation or ability to move in the area where medication was injected. Nausea, vomiting, constipation Rarely, numbness and tingling in your mouth or lips, lightheadedness, or anxiety may occur. Call your doctor right away if you think you may be experiencing any of these sensations, or if you have other questions regarding possible side effects.  Follow all other discharge instructions given to you by your surgeon or nurse. Eat a healthy diet and drink plenty of water or other fluids.  No tylenol  until after 3pm

## 2023-08-29 NOTE — Anesthesia Preprocedure Evaluation (Addendum)
 Anesthesia Evaluation  Patient identified by MRN, date of birth, ID band Patient awake    Reviewed: Allergy & Precautions, NPO status , Patient's Chart, lab work & pertinent test results  History of Anesthesia Complications (+) history of anesthetic complications  Airway Mallampati: I  TM Distance: >3 FB Neck ROM: Full    Dental  (+) Teeth Intact, Dental Advisory Given   Pulmonary asthma    breath sounds clear to auscultation       Cardiovascular negative cardio ROS  Rhythm:Regular Rate:Normal     Neuro/Psych  Headaches  negative psych ROS   GI/Hepatic negative GI ROS, Neg liver ROS,,,  Endo/Other  Hypothyroidism    Renal/GU negative Renal ROS     Musculoskeletal negative musculoskeletal ROS (+)    Abdominal   Peds  Hematology  (+) Blood dyscrasia, anemia   Anesthesia Other Findings   Reproductive/Obstetrics                              Anesthesia Physical Anesthesia Plan  ASA: 2  Anesthesia Plan: General   Post-op Pain Management: Tylenol  PO (pre-op)* and Toradol  IV (intra-op)*   Induction: Intravenous  PONV Risk Score and Plan: 4 or greater and Ondansetron , Dexamethasone , Midazolam  and Scopolamine  patch - Pre-op  Airway Management Planned: Oral ETT  Additional Equipment: None  Intra-op Plan:   Post-operative Plan: Extubation in OR  Informed Consent: I have reviewed the patients History and Physical, chart, labs and discussed the procedure including the risks, benefits and alternatives for the proposed anesthesia with the patient or authorized representative who has indicated his/her understanding and acceptance.     Dental advisory given  Plan Discussed with: CRNA  Anesthesia Plan Comments:          Anesthesia Quick Evaluation

## 2023-08-29 NOTE — Anesthesia Postprocedure Evaluation (Signed)
 Anesthesia Post Note  Patient: Mattalynn Crandle Bogacz  Procedure(s) Performed: BREAST REDUCTION WITH LIPOSUCTION (Bilateral: Breast)     Patient location during evaluation: PACU Anesthesia Type: General Level of consciousness: awake and alert Pain management: pain level controlled Vital Signs Assessment: post-procedure vital signs reviewed and stable Respiratory status: spontaneous breathing, nonlabored ventilation, respiratory function stable and patient connected to nasal cannula oxygen Cardiovascular status: blood pressure returned to baseline and stable Postop Assessment: no apparent nausea or vomiting Anesthetic complications: no   No notable events documented.  Last Vitals:  Vitals:   08/29/23 1130 08/29/23 1151  BP: 123/76 114/64  Pulse: 90 75  Resp: 15 16  Temp:  36.7 C  SpO2: 95% 98%    Last Pain:  Vitals:   08/29/23 1219  TempSrc:   PainSc: 4                  Franky JONETTA Bald

## 2023-08-30 ENCOUNTER — Encounter (HOSPITAL_BASED_OUTPATIENT_CLINIC_OR_DEPARTMENT_OTHER): Payer: Self-pay | Admitting: Plastic Surgery

## 2023-08-30 ENCOUNTER — Encounter: Payer: Self-pay | Admitting: Plastic Surgery

## 2023-09-02 LAB — SURGICAL PATHOLOGY

## 2023-09-06 ENCOUNTER — Ambulatory Visit (INDEPENDENT_AMBULATORY_CARE_PROVIDER_SITE_OTHER): Admitting: Plastic Surgery

## 2023-09-06 ENCOUNTER — Encounter: Payer: Self-pay | Admitting: Plastic Surgery

## 2023-09-06 VITALS — BP 132/73 | HR 78

## 2023-09-06 DIAGNOSIS — N62 Hypertrophy of breast: Secondary | ICD-10-CM

## 2023-09-06 NOTE — Progress Notes (Signed)
 The patient is a 48 year old female here for follow-up after undergoing bilateral breast reduction August 14.  She had around 1000 g removed from her breasts.  No sign of infection or seroma.  She is overall pleased.  Pictures were obtained of the patient and placed in the chart with the patient's or guardian's permission.

## 2023-09-09 ENCOUNTER — Telehealth: Payer: Self-pay | Admitting: Surgical

## 2023-09-09 NOTE — Telephone Encounter (Signed)
 lvmail 09-09-23 to r/s

## 2023-09-10 ENCOUNTER — Telehealth: Payer: Self-pay | Admitting: Surgical

## 2023-09-10 NOTE — Telephone Encounter (Signed)
 lvmail 09-10-23 to r/s

## 2023-09-19 ENCOUNTER — Ambulatory Visit (INDEPENDENT_AMBULATORY_CARE_PROVIDER_SITE_OTHER): Admitting: Student

## 2023-09-19 DIAGNOSIS — N62 Hypertrophy of breast: Secondary | ICD-10-CM

## 2023-09-19 NOTE — Progress Notes (Signed)
 Patient is a 48 year old female who recently underwent bilateral breast reduction with Dr. Lowery on 08/29/2023.  Patient is 3 weeks postop.  She presents to the clinic today for postoperative follow-up.  Patient was last seen in the clinic on 09/06/2023.  At this visit, there is no sign of infection or seroma.  Patient was overall pleased.  Today, patient reports she is overall doing well.  She reports that she notices a malodor from her breasts.  She denies any drainage, fevers or chills.  She reports she has some soreness from time to time, but denies any significant pain.  She denies any other issues or concerns at this time.  Chaperone present on exam.  On exam, patient is sitting upright in no acute distress.  Breasts are overall soft and symmetric.  There is no overlying erythema, no obvious fluid collections on exam.  NAC's appear to be healthy bilaterally.  Mepilex border dressings and Steri-Strips are in place over the incisions.  These were removed.  Incisions appear to be intact and healing well.  There is no drainage noted on exam.  No open wounds noted.  There are no signs of infection on exam.  Discussed with the patient that the odor she was smelling was most likely from the Steri-Strips.  I recommended that she keep the incision clean, and apply Vaseline to the incisions once a day.  Patient expressed understanding.  Discussed with patient to continue compression at all times and avoid strenuous activities, but she may start doing gentle range of motion with her upper extremities.  She expressed understanding.  Patient to follow back up in about 2 weeks.  I instructed her to call if she has any questions or concerns about anything.

## 2023-09-24 ENCOUNTER — Encounter: Admitting: Surgical

## 2023-10-07 ENCOUNTER — Ambulatory Visit (INDEPENDENT_AMBULATORY_CARE_PROVIDER_SITE_OTHER): Admitting: Student

## 2023-10-07 DIAGNOSIS — N62 Hypertrophy of breast: Secondary | ICD-10-CM

## 2023-10-07 NOTE — Progress Notes (Signed)
 Patient is a 48 year old female who recently underwent bilateral breast reduction with Dr. Lowery on 08/29/2023.  Patient is almost 6 weeks postop.  She presents to the clinic today for postoperative follow-up.   Patient was last seen in the clinic on 09/19/2023.  At this visit, patient was doing well.  On exam, breasts were overall soft and symmetric.  There is no obvious fluid collection on exam.  NAC's were healthy.  Incisions were intact and healing well.  Recommended that patient keep the incisions clean and apply Vaseline to the incisions once a day.  Today, patient reports she is doing well.  She states that she is moving more and has not had any new significant pain.  She denies any fevers or chills.  She denies any other issues or concerns at this time.  Chaperone present on exam.  On exam, patient is sitting upright in no acute distress.  Breasts are overall soft and symmetric.  There is a little bit of firmness noted to the medial right breast and a little bit of firmness noted out to the lateral left breast consistent with scar tissue.  There are no overlying skin changes.  There is no overlying erythema.  No fluid collections palpated to either breast.  Incisions are well-healed.  There is still with some residual Dermabond.  There was a Monocryl suture protruding from the skin to the inferior T-zone of the right breast.  This was cut and removed.  Patient tolerated well.  There are no signs of infection on exam.  I recommended that the patient apply Vaseline to her incisions for the next week or so to help work off the rest of the Dermabond, and then she may start applying scar creams to her incisions.  We discussed options including Mederma, silicone tapes and creams.  I also recommended that patient massage the areas of firmness.  I discussed with her that she should monitor these.  I discussed with her that if they do not soften up over the next few months she should let us  know.  I also  discussed with her that if she feels they are growing in size she should definitely let us  know.  Patient expressed understanding.  I discussed with the patient that she may start gradually increasing her activities.  I discussed with her she no longer has restrictions.  I discussed with her that she may transition into a regular bra without underwire.  Patient expressed understanding.  Patient to follow-up as needed.  I instructed her to call if she has any questions or concerns about intake.  Pictures were obtained of the patient and placed in the chart with the patient's or guardian's permission.

## 2023-10-08 ENCOUNTER — Encounter: Admitting: Surgical
